# Patient Record
Sex: Male | Born: 1960 | Race: Black or African American | Hispanic: No | Marital: Married | Smoking: Never smoker
Health system: Southern US, Community
[De-identification: ages and names within clinical notes are randomized; demographics above are authoritative.]

## PROBLEM LIST (undated history)

## (undated) DIAGNOSIS — I10 Essential (primary) hypertension: Secondary | ICD-10-CM

## (undated) DIAGNOSIS — E119 Type 2 diabetes mellitus without complications: Secondary | ICD-10-CM

## (undated) HISTORY — PX: WISDOM TOOTH EXTRACTION: SHX21

---

## 2016-05-22 ENCOUNTER — Telehealth: Payer: Self-pay | Admitting: Family Medicine

## 2016-05-22 DIAGNOSIS — Z7689 Persons encountering health services in other specified circumstances: Secondary | ICD-10-CM

## 2016-05-22 DIAGNOSIS — Z125 Encounter for screening for malignant neoplasm of prostate: Secondary | ICD-10-CM

## 2016-05-22 NOTE — Telephone Encounter (Signed)
Labs ordered and patient aware- not on any medication currently

## 2016-05-22 NOTE — Telephone Encounter (Signed)
It would be helpful to know if the patient is on any particular medications if not on any medications I recommend the following CBC, met 7, lipid, liver, PSA if on any medications please let us know

## 2016-05-22 NOTE — Telephone Encounter (Signed)
Please advise which labs are needed

## 2016-05-22 NOTE — Telephone Encounter (Signed)
Pt is needing lab orders sent over for an upcoming wellness visit. Pt is a new pt so there is nothing in the system.

## 2016-05-31 DIAGNOSIS — Z7689 Persons encountering health services in other specified circumstances: Secondary | ICD-10-CM | POA: Diagnosis not present

## 2016-05-31 DIAGNOSIS — Z125 Encounter for screening for malignant neoplasm of prostate: Secondary | ICD-10-CM | POA: Diagnosis not present

## 2016-05-31 DIAGNOSIS — Z0000 Encounter for general adult medical examination without abnormal findings: Secondary | ICD-10-CM | POA: Diagnosis not present

## 2016-06-02 LAB — HEPATIC FUNCTION PANEL
ALT: 17 IU/L (ref 0–44)
AST: 13 IU/L (ref 0–40)
Albumin: 4.8 g/dL (ref 3.5–5.5)
Alkaline Phosphatase: 85 IU/L (ref 39–117)
BILIRUBIN, DIRECT: 0.09 mg/dL (ref 0.00–0.40)
Bilirubin Total: 0.5 mg/dL (ref 0.0–1.2)
Total Protein: 8 g/dL (ref 6.0–8.5)

## 2016-06-02 LAB — LIPID PANEL
CHOLESTEROL TOTAL: 318 mg/dL — AB (ref 100–199)
Chol/HDL Ratio: 7.1 ratio units — ABNORMAL HIGH (ref 0.0–5.0)
HDL: 45 mg/dL (ref >39)
LDL Calculated: 215 mg/dL — ABNORMAL HIGH (ref 0–99)
Triglycerides: 291 mg/dL — ABNORMAL HIGH (ref 0–149)
VLDL CHOLESTEROL CAL: 58 mg/dL — AB (ref 5–40)

## 2016-06-02 LAB — BASIC METABOLIC PANEL
BUN / CREAT RATIO: 16 (ref 9–20)
BUN: 16 mg/dL (ref 6–24)
CHLORIDE: 99 mmol/L (ref 96–106)
CO2: 21 mmol/L (ref 18–29)
Calcium: 9.6 mg/dL (ref 8.7–10.2)
Creatinine, Ser: 0.97 mg/dL (ref 0.76–1.27)
GFR, EST AFRICAN AMERICAN: 102 mL/min/{1.73_m2} (ref >59)
GFR, EST NON AFRICAN AMERICAN: 88 mL/min/{1.73_m2} (ref >59)
Glucose: 187 mg/dL — ABNORMAL HIGH (ref 65–99)
POTASSIUM: 4.8 mmol/L (ref 3.5–5.2)
Sodium: 142 mmol/L (ref 134–144)

## 2016-06-02 LAB — CBC
Hematocrit: 40.3 % (ref 37.5–51.0)
Hemoglobin: 12.7 g/dL — ABNORMAL LOW (ref 13.0–17.7)
MCH: 22 pg — AB (ref 26.6–33.0)
MCHC: 31.5 g/dL (ref 31.5–35.7)
MCV: 70 fL — ABNORMAL LOW (ref 79–97)
PLATELETS: 351 10*3/uL (ref 150–379)
RBC: 5.77 x10E6/uL (ref 4.14–5.80)
RDW: 16.7 % — AB (ref 12.3–15.4)
WBC: 6.5 10*3/uL (ref 3.4–10.8)

## 2016-06-02 LAB — PSA: PROSTATE SPECIFIC AG, SERUM: 0.4 ng/mL (ref 0.0–4.0)

## 2016-06-05 ENCOUNTER — Encounter: Payer: Self-pay | Admitting: Family Medicine

## 2016-06-18 ENCOUNTER — Encounter: Payer: Self-pay | Admitting: Family Medicine

## 2016-07-08 ENCOUNTER — Ambulatory Visit (INDEPENDENT_AMBULATORY_CARE_PROVIDER_SITE_OTHER): Payer: BLUE CROSS/BLUE SHIELD | Admitting: Family Medicine

## 2016-07-08 ENCOUNTER — Encounter: Payer: Self-pay | Admitting: Family Medicine

## 2016-07-08 VITALS — BP 148/90 | Ht 69.0 in | Wt 224.4 lb

## 2016-07-08 DIAGNOSIS — D649 Anemia, unspecified: Secondary | ICD-10-CM | POA: Diagnosis not present

## 2016-07-08 DIAGNOSIS — E1169 Type 2 diabetes mellitus with other specified complication: Secondary | ICD-10-CM | POA: Diagnosis not present

## 2016-07-08 DIAGNOSIS — Z1211 Encounter for screening for malignant neoplasm of colon: Secondary | ICD-10-CM

## 2016-07-08 DIAGNOSIS — Z0000 Encounter for general adult medical examination without abnormal findings: Secondary | ICD-10-CM | POA: Diagnosis not present

## 2016-07-08 DIAGNOSIS — R030 Elevated blood-pressure reading, without diagnosis of hypertension: Secondary | ICD-10-CM

## 2016-07-08 DIAGNOSIS — R739 Hyperglycemia, unspecified: Secondary | ICD-10-CM

## 2016-07-08 DIAGNOSIS — E118 Type 2 diabetes mellitus with unspecified complications: Secondary | ICD-10-CM | POA: Insufficient documentation

## 2016-07-08 DIAGNOSIS — E785 Hyperlipidemia, unspecified: Secondary | ICD-10-CM | POA: Diagnosis not present

## 2016-07-08 LAB — POCT GLYCOSYLATED HEMOGLOBIN (HGB A1C): HEMOGLOBIN A1C: 7.4

## 2016-07-08 MED ORDER — ATORVASTATIN CALCIUM 10 MG PO TABS: 10.0000 mg | ORAL_TABLET | Freq: Every day | ORAL | 4 refills | Status: DC

## 2016-07-08 MED ORDER — METFORMIN HCL 500 MG PO TABS: 500.0000 mg | ORAL_TABLET | Freq: Two times a day (BID) | ORAL | 4 refills | Status: DC

## 2016-07-08 NOTE — Patient Instructions (Signed)
Diabetes Mellitus and Food It is important for you to manage your blood sugar (glucose) level. Your blood glucose level can be greatly affected by what you eat. Eating healthier foods in the appropriate amounts throughout the day at about the same time each day will help you control your blood glucose level. It can also help slow or prevent worsening of your diabetes mellitus. Healthy eating may even help you improve the level of your blood pressure and reach or maintain a healthy weight. General recommendations for healthful eating and cooking habits include:  Eating meals and snacks regularly. Avoid going long periods of time without eating to lose weight.  Eating a diet that consists mainly of plant-based foods, such as fruits, vegetables, nuts, legumes, and whole grains.  Using low-heat cooking methods, such as baking, instead of high-heat cooking methods, such as deep frying.  Work with your dietitian to make sure you understand how to use the Nutrition Facts information on food labels. How can food affect me? Carbohydrates Carbohydrates affect your blood glucose level more than any other type of food. Your dietitian will help you determine how many carbohydrates to eat at each meal and teach you how to count carbohydrates. Counting carbohydrates is important to keep your blood glucose at a healthy level, especially if you are using insulin or taking certain medicines for diabetes mellitus. Alcohol Alcohol can cause sudden decreases in blood glucose (hypoglycemia), especially if you use insulin or take certain medicines for diabetes mellitus. Hypoglycemia can be a life-threatening condition. Symptoms of hypoglycemia (sleepiness, dizziness, and disorientation) are similar to symptoms of having too much alcohol. If your health care provider has given you approval to drink alcohol, do so in moderation and use the following guidelines:  Women should not have more than one drink per day, and men  should not have more than two drinks per day. One drink is equal to: ? 12 oz of beer. ? 5 oz of wine. ? 1 oz of hard liquor.  Do not drink on an empty stomach.  Keep yourself hydrated. Have water, diet soda, or unsweetened iced tea.  Regular soda, juice, and other mixers might contain a lot of carbohydrates and should be counted.  What foods are not recommended? As you make food choices, it is important to remember that all foods are not the same. Some foods have fewer nutrients per serving than other foods, even though they might have the same number of calories or carbohydrates. It is difficult to get your body what it needs when you eat foods with fewer nutrients. Examples of foods that you should avoid that are high in calories and carbohydrates but low in nutrients include:  Trans fats (most processed foods list trans fats on the Nutrition Facts label).  Regular soda.  Juice.  Candy.  Sweets, such as cake, pie, doughnuts, and cookies.  Fried foods.  What foods can I eat? Eat nutrient-rich foods, which will nourish your body and keep you healthy. The food you should eat also will depend on several factors, including:  The calories you need.  The medicines you take.  Your weight.  Your blood glucose level.  Your blood pressure level.  Your cholesterol level.  You should eat a variety of foods, including:  Protein. ? Lean cuts of meat. ? Proteins low in saturated fats, such as fish, egg whites, and beans. Avoid processed meats.  Fruits and vegetables. ? Fruits and vegetables that may help control blood glucose levels, such as apples,   yams.  Dairy products.  Choose fat-free or low-fat dairy products, such as milk, yogurt, and cheese.  Grains, bread, pasta, and rice.  Choose whole grain products, such as multigrain bread, whole oats, and brown rice. These foods may help control blood pressure.  Fats.  Foods containing healthful fats, such as nuts,  avocado, olive oil, canola oil, and fish. Does everyone with diabetes mellitus have the same meal plan? Because every person with diabetes mellitus is different, there is not one meal plan that works for everyone. It is very important that you meet with a dietitian who will help you create a meal plan that is just right for you. This information is not intended to replace advice given to you by your health care provider. Make sure you discuss any questions you have with your health care provider. Document Released: 01/30/2005 Document Revised: 10/11/2015 Document Reviewed: 04/01/2013 Elsevier Interactive Patient Education  2017 [place] DASH stands for "Dietary Approaches to Stop Hypertension." The DASH eating plan is a healthy eating plan that has been shown to reduce high blood pressure (hypertension). Additional health benefits may include reducing the risk of type 2 diabetes mellitus, heart disease, and stroke. The DASH eating plan may also help with weight loss. What do I need to know about the DASH eating plan? For the DASH eating plan, you will follow these general guidelines:  Choose foods with less than 150 milligrams of sodium per serving (as listed on the food label).  Use salt-free seasonings or herbs instead of table salt or sea salt.  Check with your health care provider or pharmacist before using salt substitutes.  Eat lower-sodium products. These are often labeled as "low-sodium" or "no salt added."  Eat fresh foods. Avoid eating a lot of canned foods.  Eat more vegetables, fruits, and low-fat dairy products.  Choose whole grains. Look for the word "whole" as the first word in the ingredient list.  Choose fish and skinless chicken or [place] more often than red meat. Limit fish, poultry, and meat to 6 oz (170 g) each day.  Limit sweets, desserts, sugars, and sugary drinks.  Choose heart-healthy fats.  Eat more home-cooked food and less restaurant,  buffet, and fast food.  Limit fried foods.  Do not fry foods. Cook foods using methods such as baking, boiling, grilling, and broiling instead.  When eating at a restaurant, ask that your food be prepared with less salt, or no salt if possible. What foods can I eat? Seek help from a dietitian for individual calorie needs. Grains  Whole grain or whole wheat bread. Brown rice. Whole grain or whole wheat pasta. Quinoa, bulgur, and whole grain cereals. Low-sodium cereals. Corn or whole wheat flour tortillas. Whole grain cornbread. Whole grain crackers. Low-sodium crackers. Vegetables  Fresh or frozen vegetables (raw, steamed, roasted, or grilled). Low-sodium or reduced-sodium tomato and vegetable juices. Low-sodium or reduced-sodium tomato sauce and paste. Low-sodium or reduced-sodium canned vegetables. Fruits  All fresh, canned (in natural juice), or frozen fruits. Meat and Other Protein Products  Ground beef (85% or leaner), grass-fed beef, or beef trimmed of fat. Skinless chicken or [place]. Ground chicken or [place]. Pork trimmed of fat. All fish and seafood. Eggs. Dried beans, peas, or lentils. Unsalted nuts and seeds. Unsalted canned beans. Dairy  Low-fat dairy products, such as skim or 1% milk, 2% or reduced-fat cheeses, low-fat ricotta or cottage cheese, or plain low-fat yogurt. Low-sodium or reduced-sodium cheeses. Fats and Oils  Tub margarines without  trans fats. Light or reduced-fat mayonnaise and salad dressings (reduced sodium). Avocado. Safflower, olive, or canola oils. Natural peanut or almond butter. Other  Unsalted popcorn and pretzels. The items listed above may not be a complete list of recommended foods or beverages. Contact your dietitian for more options.  What foods are not recommended? Grains  White bread. White pasta. White rice. Refined cornbread. Bagels and croissants. Crackers that contain trans fat. Vegetables  Creamed or fried vegetables. Vegetables in a cheese  sauce. Regular canned vegetables. Regular canned tomato sauce and paste. Regular tomato and vegetable juices. Fruits  Canned fruit in light or heavy syrup. Fruit juice. Meat and Other Protein Products  Fatty cuts of meat. Ribs, chicken wings, bacon, sausage, bologna, salami, chitterlings, fatback, hot dogs, bratwurst, and packaged luncheon meats. Salted nuts and seeds. Canned beans with salt. Dairy  Whole or 2% milk, cream, half-and-half, and cream cheese. Whole-fat or sweetened yogurt. Full-fat cheeses or blue cheese. Nondairy creamers and whipped toppings. Processed cheese, cheese spreads, or cheese curds. Condiments  Onion and garlic salt, seasoned salt, table salt, and sea salt. Canned and packaged gravies. Worcestershire sauce. Tartar sauce. Barbecue sauce. Teriyaki sauce. Soy sauce, including reduced sodium. Steak sauce. Fish sauce. Oyster sauce. Cocktail sauce. Horseradish. Ketchup and mustard. Meat flavorings and tenderizers. Bouillon cubes. Hot sauce. Tabasco sauce. Marinades. Taco seasonings. Relishes. Fats and Oils  Butter, stick margarine, lard, shortening, ghee, and bacon fat. Coconut, palm kernel, or palm oils. Regular salad dressings. Other  Pickles and olives. Salted popcorn and pretzels. The items listed above may not be a complete list of foods and beverages to avoid. Contact your dietitian for more information.  Where can I find more information? National Heart, Lung, and Blood Institute: travelstabloid.com This information is not intended to replace advice given to you by your health care provider. Make sure you discuss any questions you have with your health care provider. Document Released: 04/24/2011 Document Revised: 10/11/2015 Document Reviewed: 03/09/2013 Elsevier Interactive Patient Education  2017 [employer].

## 2016-07-08 NOTE — Progress Notes (Signed)
   Subjective:    Patient ID: Fernando Vance, male    DOB: 12-04-1960, 56 y.o.   MRN: [id number]  HPI The patient comes in today for a wellness visit.    A review of their health history was completed.  A review of medications was also completed.  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   Additional concerns: none Patient does not smoke or drink He tries to watch how he eats He does stay physically active He denies any chest tightness pressure pain shortness breath rectal bleeding or hematuria   Review of Systems  Constitutional: Negative for activity change, appetite change and fever.  HENT: Negative for congestion and rhinorrhea.   Eyes: Negative for discharge.  Respiratory: Negative for cough and wheezing.   Cardiovascular: Negative for chest pain.  Gastrointestinal: Negative for abdominal pain, blood in stool and vomiting.  Genitourinary: Negative for difficulty urinating and frequency.  Musculoskeletal: Negative for neck pain.  Skin: Negative for rash.  Allergic/Immunologic: Negative for environmental allergies and food allergies.  Neurological: Negative for weakness and headaches.  Psychiatric/Behavioral: Negative for agitation.       Objective:   Physical Exam  Constitutional: He appears well-developed and well-nourished.  HENT:  Head: Normocephalic and atraumatic.  Right Ear: External ear normal.  Left Ear: External ear normal.  Nose: Nose normal.  Mouth/Throat: Oropharynx is clear and moist.  Eyes: EOM are normal. Pupils are equal, round, and reactive to light.  Neck: Normal range of motion. Neck supple. No thyromegaly present.  Cardiovascular: Normal rate, regular rhythm and normal heart sounds.   No murmur heard. Pulmonary/Chest: Effort normal and breath  sounds normal. No respiratory distress. He has no wheezes.  Abdominal: Soft. Bowel sounds are normal. He exhibits no distension and no mass. There is no tenderness.  Genitourinary: Prostate normal and penis normal.  Musculoskeletal: Normal range of motion. He exhibits no edema.  Lymphadenopathy:    He has no cervical adenopathy.  Neurological: He is alert. He exhibits normal muscle tone.  Skin: Skin is warm and dry. No erythema.  Psychiatric: He has a normal mood and affect. His behavior is normal. Judgment normal.          Assessment & Plan:  Adult wellness-complete.wellness physical was conducted today. Importance of diet and exercise were discussed in detail. In addition to this a discussion regarding safety was also covered. We also reviewed over immunizations and gave recommendations regarding current immunization needed for age. In addition to this additional areas were also touched on including: Preventative health exams needed: Colonoscopy Is due for colonoscopy  Patient was advised yearly wellness exam  Hemoglobin slightly low. Needs further workup. I recommend the patient be seen by gastroenterology will need colonoscopy. Also CBC, ferritin, TIBC.  Hyperlipidemia LDL excessively elevated risk of heart disease is significant. I recommend statin.  Low-salt diet regular physical activity recommended  Diabetes-A1c 7.4 start metformin 500 mg twice a day watch diet flyer for the diabetes free classes was given to them  Set up for colonoscopy  Follow-up 4 weeks for high blood pressure recheck may need to be on medication low-salt diet regular physical activity

## 2016-07-08 NOTE — Progress Notes (Signed)
Referral ordered in EPIC. 

## 2016-07-09 ENCOUNTER — Encounter: Payer: Self-pay | Admitting: Family Medicine

## 2016-07-11 DIAGNOSIS — D649 Anemia, unspecified: Secondary | ICD-10-CM | POA: Diagnosis not present

## 2016-07-12 LAB — IRON AND TIBC
IRON SATURATION: 17 % (ref 15–55)
IRON: 58 ug/dL (ref 38–169)
Total Iron Binding Capacity: 332 ug/dL (ref 250–450)
UIBC: 274 ug/dL (ref 111–343)

## 2016-07-12 LAB — CBC WITH DIFFERENTIAL/PLATELET
BASOS ABS: 0 10*3/uL (ref 0.0–0.2)
BASOS: 0 %
EOS (ABSOLUTE): 0.1 10*3/uL (ref 0.0–0.4)
Eos: 2 %
HEMOGLOBIN: 12.5 g/dL — AB (ref 13.0–17.7)
Hematocrit: 39.8 % (ref 37.5–51.0)
IMMATURE GRANS (ABS): 0 10*3/uL (ref 0.0–0.1)
Immature Granulocytes: 0 %
LYMPHS: 42 %
Lymphocytes Absolute: 2.3 10*3/uL (ref 0.7–3.1)
MCH: 22 pg — AB (ref 26.6–33.0)
MCHC: 31.4 g/dL — ABNORMAL LOW (ref 31.5–35.7)
MCV: 70 fL — AB (ref 79–97)
MONOCYTES: 9 %
Monocytes Absolute: 0.5 10*3/uL (ref 0.1–0.9)
NEUTROS ABS: 2.6 10*3/uL (ref 1.4–7.0)
Neutrophils: 47 %
Platelets: 358 10*3/uL (ref 150–379)
RBC: 5.68 x10E6/uL (ref 4.14–5.80)
RDW: 16.7 % — ABNORMAL HIGH (ref 12.3–15.4)
WBC: 5.5 10*3/uL (ref 3.4–10.8)

## 2016-07-12 LAB — FERRITIN: FERRITIN: 192 ng/mL (ref 30–400)

## 2016-07-22 ENCOUNTER — Telehealth: Payer: Self-pay

## 2016-07-22 NOTE — Telephone Encounter (Signed)
Gastroenterology Pre-Procedure Review  Request Date: Requesting Physician:   PATIENT REVIEW QUESTIONS: The patient responded to the following health history questions as indicated:    1. Diabetes Melitis: YES 2. Joint replacements in the past 12 months: NO 3. Major health problems in the past 3 months: NO 4. Has an artificial valve or MVP: NO 5. Has a defibrillator: NO 6. Has been advised in past to take antibiotics in advance of a procedure like teeth cleaning: NO 7. Family history of colon cancer: NO 8. Alcohol Use: NO 9. History of sleep apnea: NO  10. History of coronary artery or other vascular stents placed within the last 12 months: NO    MEDICATIONS & ALLERGIES:    Patient reports the following regarding taking any blood thinners:   Plavix? NO Aspirin? NO Coumadin? NO Brilinta? NO Xarelto? NO Eliquis? NO Pradaxa? NO Savaysa? NO Effient? NO  Patient confirms/reports the following medications:  Current Outpatient Prescriptions  Medication Sig Dispense Refill  . atorvastatin (LIPITOR) 10 MG tablet Take 1 tablet (10 mg total) by mouth daily. 30 tablet 4  . metFORMIN (GLUCOPHAGE) 500 MG tablet Take 1 tablet (500 mg total) by mouth 2 (two) times daily with a meal. 60 tablet 4   No current facility-administered medications for this visit.     Patient confirms/reports the following allergies:  No Known Allergies  No orders of the defined types were placed in this encounter.   AUTHORIZATION INFORMATION Primary Insurance: BC[employer]  ID #: [id number],  Group #: [id number] Pre-Cert / Josem Kaufmann required:  Pre-Cert / Auth #:    SCHEDULE INFORMATION: Procedure has been scheduled as follows:  Date: , Time:  Location:   This Gastroenterology Pre-Precedure Review Form is being routed to the following provider(s):

## 2016-07-23 NOTE — Telephone Encounter (Signed)
Ok to schedule.   Day of prep: take 250mg  metformin bid.  AM of procedure: Hold metformin.

## 2016-07-23 NOTE — Telephone Encounter (Signed)
LMOM to call back

## 2016-07-28 NOTE — Telephone Encounter (Signed)
LMOM for him to call me back

## 2016-07-30 ENCOUNTER — Other Ambulatory Visit: Payer: Self-pay

## 2016-07-30 DIAGNOSIS — Z1211 Encounter for screening for malignant neoplasm of colon: Secondary | ICD-10-CM

## 2016-07-30 MED ORDER — PEG 3350-KCL-NA BICARB-NACL 420 G PO SOLR: 4000.0000 mL | ORAL | 0 refills | Status: DC

## 2016-07-31 NOTE — Telephone Encounter (Signed)
Pt came by the office and he is set up for TCS on 08/18/16. He is aware and has instructions

## 2016-08-05 ENCOUNTER — Encounter: Payer: Self-pay | Admitting: Family Medicine

## 2016-08-05 ENCOUNTER — Ambulatory Visit (INDEPENDENT_AMBULATORY_CARE_PROVIDER_SITE_OTHER): Payer: BLUE CROSS/BLUE SHIELD | Admitting: Family Medicine

## 2016-08-05 VITALS — BP 160/110 | Ht 69.0 in | Wt 228.5 lb

## 2016-08-05 DIAGNOSIS — R011 Cardiac murmur, unspecified: Secondary | ICD-10-CM

## 2016-08-05 DIAGNOSIS — E1169 Type 2 diabetes mellitus with other specified complication: Secondary | ICD-10-CM

## 2016-08-05 DIAGNOSIS — E785 Hyperlipidemia, unspecified: Secondary | ICD-10-CM

## 2016-08-05 DIAGNOSIS — I10 Essential (primary) hypertension: Secondary | ICD-10-CM | POA: Diagnosis not present

## 2016-08-05 DIAGNOSIS — E118 Type 2 diabetes mellitus with unspecified complications: Secondary | ICD-10-CM | POA: Diagnosis not present

## 2016-08-05 LAB — POCT GLUCOSE (DEVICE FOR HOME USE): Glucose Fasting, POC: 164 mg/dL — AB (ref 70–99)

## 2016-08-05 MED ORDER — LISINOPRIL 5 MG PO TABS: 5.0000 mg | ORAL_TABLET | Freq: Every day | ORAL | 6 refills | Status: DC

## 2016-08-05 NOTE — Progress Notes (Signed)
   Subjective:    Patient ID: Fernando Vance, male    DOB: 06-30-1960, 56 y.o.   MRN: [id number]  Hypertension  This is a recurrent problem. The current episode started more than 1 month ago. Pertinent negatives include no chest pain.  Exceptionally nice gentleman comes today for a follow-up visit regarding diabetes and regarding his hyperlipidemia as well as diabetes. He is try to watch his diet he is trying to stay active he denies any chest tightness pressure pain shortness breath denies rectal bleeding or hematuria  Patient states no other concerns this visit.   Results for orders placed or performed in visit on 08/05/16  POCT Glucose (Device for Home Use)  Result Value Ref Range   Glucose Fasting, POC 164 (A) 70 - 99 mg/dL   POC Glucose  70 - 99 mg/dl     Review of Systems  Constitutional: Negative for activity change, appetite change and fatigue.  HENT: Negative for congestion.   Respiratory: Negative for cough.   Cardiovascular: Negative for chest pain.  Gastrointestinal: Negative for abdominal pain.  Endocrine: Negative for polydipsia and polyphagia.  Neurological: Negative for weakness.  Psychiatric/Behavioral: Negative for confusion.       Objective:   Physical Exam  Constitutional: He appears well-nourished. No distress.  Cardiovascular: Normal rate and regular rhythm.   Murmur heard. Pulmonary/Chest: Effort normal and breath sounds normal. No respiratory distress.  Musculoskeletal: He exhibits no edema.  Lymphadenopathy:    He has no cervical adenopathy.  Neurological: He is alert.  Psychiatric: His behavior is normal.  Vitals reviewed.         Assessment & Plan:  Hyperglycemia continue the metformin. Will monitor her A1 C's. If A1c is not coming down where we want to we will need to increase the dose  Hyperlipidemia do a follow-up lipid profile if it is not significant improvement we will need to do additional changes to the dosing  Hypertension  start lisinopril 5 mg daily repeat met 7+ also follow-up regarding blood pressure  Patient also has systolic murmur heard mainly on the right sternal border it is a 3 out of 6 strength-patient denies having history of murmur. Patient will need to have echo because of the harshness of this murmur

## 2016-08-18 ENCOUNTER — Encounter (HOSPITAL_COMMUNITY)
Admission: RE | Disposition: A | Discharge status: Home / Self Care | Payer: Self-pay | Source: Ambulatory Visit | Attending: Gastroenterology

## 2016-08-18 ENCOUNTER — Ambulatory Visit: Payer: Self-pay | Admitting: Cardiovascular Disease

## 2016-08-18 ENCOUNTER — Encounter (HOSPITAL_COMMUNITY): Payer: Self-pay

## 2016-08-18 ENCOUNTER — Ambulatory Visit (HOSPITAL_COMMUNITY)
Admission: RE | Admit: 2016-08-18 | Discharge: 2016-08-18 | Disposition: A | Discharge status: Home / Self Care | Payer: BLUE CROSS/BLUE SHIELD | Source: Ambulatory Visit | Attending: Gastroenterology | Admitting: Gastroenterology

## 2016-08-18 DIAGNOSIS — K648 Other hemorrhoids: Secondary | ICD-10-CM | POA: Diagnosis not present

## 2016-08-18 DIAGNOSIS — Z1211 Encounter for screening for malignant neoplasm of colon: Secondary | ICD-10-CM

## 2016-08-18 DIAGNOSIS — I10 Essential (primary) hypertension: Secondary | ICD-10-CM | POA: Insufficient documentation

## 2016-08-18 DIAGNOSIS — Q438 Other specified congenital malformations of intestine: Secondary | ICD-10-CM | POA: Diagnosis not present

## 2016-08-18 DIAGNOSIS — D125 Benign neoplasm of sigmoid colon: Secondary | ICD-10-CM | POA: Insufficient documentation

## 2016-08-18 DIAGNOSIS — K5730 Diverticulosis of large intestine without perforation or abscess without bleeding: Secondary | ICD-10-CM | POA: Insufficient documentation

## 2016-08-18 DIAGNOSIS — Z7984 Long term (current) use of oral hypoglycemic drugs: Secondary | ICD-10-CM | POA: Diagnosis not present

## 2016-08-18 DIAGNOSIS — Z79899 Other long term (current) drug therapy: Secondary | ICD-10-CM | POA: Insufficient documentation

## 2016-08-18 DIAGNOSIS — E119 Type 2 diabetes mellitus without complications: Secondary | ICD-10-CM | POA: Diagnosis not present

## 2016-08-18 DIAGNOSIS — Z1212 Encounter for screening for malignant neoplasm of rectum: Secondary | ICD-10-CM | POA: Diagnosis not present

## 2016-08-18 DIAGNOSIS — K644 Residual hemorrhoidal skin tags: Secondary | ICD-10-CM | POA: Insufficient documentation

## 2016-08-18 HISTORY — DX: Essential (primary) hypertension: I10

## 2016-08-18 HISTORY — DX: Type 2 diabetes mellitus without complications: E11.9

## 2016-08-18 HISTORY — PX: COLONOSCOPY: SHX5424

## 2016-08-18 HISTORY — PX: POLYPECTOMY: SHX5525

## 2016-08-18 LAB — GLUCOSE, CAPILLARY: Glucose-Capillary: 159 mg/dL — ABNORMAL HIGH (ref 65–99)

## 2016-08-18 SURGERY — COLONOSCOPY
Anesthesia: Moderate Sedation

## 2016-08-18 MED ORDER — MIDAZOLAM HCL 5 MG/5ML IJ SOLN
INTRAMUSCULAR | Status: AC
  Filled 2016-08-18: qty 10

## 2016-08-18 MED ORDER — MEPERIDINE HCL 100 MG/ML IJ SOLN
INTRAMUSCULAR | Status: DC | PRN
Start: 2016-08-18 — End: 2016-08-18
  Administered 2016-08-18 (×2): 50 mg via INTRAVENOUS

## 2016-08-18 MED ORDER — SODIUM CHLORIDE 0.9 % IV SOLN
INTRAVENOUS | Status: DC
  Administered 2016-08-18: 10:00:00 via INTRAVENOUS

## 2016-08-18 MED ORDER — MEPERIDINE HCL 100 MG/ML IJ SOLN
INTRAMUSCULAR | Status: AC
  Filled 2016-08-18: qty 2

## 2016-08-18 MED ORDER — MIDAZOLAM HCL 5 MG/5ML IJ SOLN
INTRAMUSCULAR | Status: DC | PRN
  Administered 2016-08-18 (×2): 2 mg via INTRAVENOUS
  Administered 2016-08-18: 1 mg via INTRAVENOUS

## 2016-08-18 NOTE — Discharge Instructions (Signed)
You have moderate size lnternal and large external hemorrhoids and diverticulosis IN YOUR LEFT COLON. YOU HAD ONE SMALL POLYP REMOVED.   CONTINUE YOUR WEIGHT LOSS EFFORTS.  WHILE I DO NOT WANT TO ALARM YOU, YOUR BODY MASS INDEX IS OVER 30 WHICH MEANS YOU ARE OBESE. OBESITY IS ASSOCIATED WITH AN INCREASE RISK FOR ALL CANCERS, INCLUDING ESOPHAGEAL AND COLON CANCER.   DRINK WATER TO KEEP YOUR URINE LIGHT YELLOW.  FOLLOW A HIGH FIBER DIET. AVOID ITEMS THAT CAUSE BLOATING. See info below.   YOUR BIOPSY RESULTS WILL BE AVAILABLE IN MY CHART AFTER APR 5 AND MY OFFICE WILL CONTACT YOU IN 10-14 DAYS WITH YOUR RESULTS.   Next colonoscopy in 5-10 years.  Colonoscopy Care After Read the instructions outlined below and refer to this sheet in the next week. These discharge instructions provide you with general information on caring for yourself after you leave the hospital. While your treatment has been planned according to the most current medical practices available, unavoidable complications occasionally occur. If you have any problems or questions after discharge, call DR. Lilie Vezina, [phone].  ACTIVITY  You may resume your regular activity, but move at a slower pace for the next 24 hours.   Take frequent rest periods for the next 24 hours.   Walking will help get rid of the air and reduce the bloated feeling in your belly (abdomen).   No driving for 24 hours (because of the medicine (anesthesia) used during the test).   You may shower.   Do not sign any important legal documents or operate any machinery for 24 hours (because of the anesthesia used during the test).    NUTRITION  Drink plenty of fluids.   You may resume your normal diet as instructed by your doctor.   Begin with a light meal and progress to your normal diet. Heavy or fried foods are harder to digest and may make you feel sick to your stomach (nauseated).   Avoid alcoholic beverages for 24 hours or as instructed.     MEDICATIONS  You may resume your normal medications.   WHAT YOU CAN EXPECT TODAY  Some feelings of bloating in the abdomen.   Passage of more gas than usual.   Spotting of blood in your stool or on the toilet paper  .  IF YOU HAD POLYPS REMOVED DURING THE COLONOSCOPY:  Eat a soft diet IF YOU HAVE NAUSEA, BLOATING, ABDOMINAL PAIN, OR VOMITING.    FINDING OUT THE RESULTS OF YOUR TEST Not all test results are available during your visit. DR. Oneida Alar WILL CALL YOU WITHIN 14 DAYS OF YOUR PROCEDUE WITH YOUR RESULTS. Do not assume everything is normal if you have not heard from DR. Dyonna Jaspers, CALL HER OFFICE AT [phone].  SEEK IMMEDIATE MEDICAL ATTENTION AND CALL THE OFFICE: [phone] IF:  You have more than a spotting of blood in your stool.   Your belly is swollen (abdominal distention).   You are nauseated or vomiting.   You have a temperature over 101F.   You have abdominal pain or discomfort that is severe or gets worse throughout the day.  High-Fiber Diet A high-fiber diet changes your normal diet to include more whole grains, legumes, fruits, and vegetables. Changes in the diet involve replacing refined carbohydrates with unrefined foods. The calorie level of the diet is essentially unchanged. The Dietary Reference Intake (recommended amount) for adult males is 38 grams per day. For adult females, it is 25 grams per day. Pregnant and lactating women  should consume 28 grams of fiber per day. Fiber is the intact part of a plant that is not broken down during digestion. Functional fiber is fiber that has been isolated from the plant to provide a beneficial effect in the body. PURPOSE  Increase stool bulk.   Ease and regulate bowel movements.   Lower cholesterol.   REDUCE RISK OF COLON CANCER  INDICATIONS THAT YOU NEED MORE FIBER  Constipation and hemorrhoids.   Uncomplicated diverticulosis (intestine condition) and irritable bowel syndrome.   Weight  management.   As a protective measure against hardening of the arteries (atherosclerosis), diabetes, and cancer.   GUIDELINES FOR INCREASING FIBER IN THE DIET  Start adding fiber to the diet slowly. A gradual increase of about 5 more grams (2 slices of whole-wheat bread, 2 servings of most fruits or vegetables, or 1 bowl of high-fiber cereal) per day is best. Too rapid an increase in fiber may result in constipation, flatulence, and bloating.   Drink enough water and fluids to keep your urine clear or pale yellow. Water, juice, or caffeine-free drinks are recommended. Not drinking enough fluid may cause constipation.   Eat a variety of high-fiber foods rather than one type of fiber.   Try to increase your intake of fiber through using high-fiber foods rather than fiber pills or supplements that contain small amounts of fiber.   The goal is to change the types of food eaten. Do not supplement your present diet with high-fiber foods, but replace foods in your present diet.   INCLUDE A VARIETY OF FIBER SOURCES  Replace refined and processed grains with whole grains, canned fruits with fresh fruits, and incorporate other fiber sources. White rice, white breads, and most bakery goods contain little or no fiber.   Brown whole-grain rice, buckwheat oats, and many fruits and vegetables are all good sources of fiber. These include: broccoli, Brussels sprouts, cabbage, cauliflower, beets, sweet potatoes, white potatoes (skin on), carrots, tomatoes, eggplant, squash, berries, fresh fruits, and dried fruits.   Cereals appear to be the richest source of fiber. Cereal fiber is found in whole grains and bran. Bran is the fiber-rich outer coat of cereal grain, which is largely removed in refining. In whole-grain cereals, the bran remains. In breakfast cereals, the largest amount of fiber is found in those with "bran" in their names. The fiber content is sometimes indicated on the label.   You may need to  include additional fruits and vegetables each day.   In baking, for 1 cup white flour, you may use the following substitutions:   1 cup whole-wheat flour minus 2 tablespoons.   1/2 cup white flour plus 1/2 cup whole-wheat flour.   Polyps, Colon  A polyp is extra tissue that grows inside your body. Colon polyps grow in the large intestine. The large intestine, also called the colon, is part of your digestive system. It is a long, hollow tube at the end of your digestive tract where your body makes and stores stool. Most polyps are not dangerous. They are benign. This means they are not cancerous. But over time, some types of polyps can turn into cancer. Polyps that are smaller than a pea are usually not harmful. But larger polyps could someday become or may already be cancerous. To be safe, doctors remove all polyps and test them.    PREVENTION There is not one sure way to prevent polyps. You might be able to lower your risk of getting them if you:  Eat  more fruits and vegetables and less fatty food.   Do not smoke.   Avoid alcohol.   Exercise every day.   Lose weight if you are overweight.   Eating more calcium and folate can also lower your risk of getting polyps. Some foods that are rich in calcium are milk, cheese, and broccoli. Some foods that are rich in folate are chickpeas, kidney beans, and spinach.    Diverticulosis Diverticulosis is a common condition that develops when small pouches (diverticula) form in the wall of the colon. The risk of diverticulosis increases with age. It happens more often in people who eat a low-fiber diet. Most individuals with diverticulosis have no symptoms. Those individuals with symptoms usually experience belly (abdominal) pain, constipation, or loose stools (diarrhea).  HOME CARE INSTRUCTIONS  Increase the amount of fiber in your diet as directed by your caregiver or dietician. This may reduce symptoms of diverticulosis.   Drink at least 6  to 8 glasses of water each day to prevent constipation.   Try not to strain when you have a bowel movement.   Avoiding nuts and seeds to prevent complications is NOT NECESSARY.   FOODS HAVING HIGH FIBER CONTENT INCLUDE:  Fruits. Apple, peach, pear, tangerine, raisins, prunes.   Vegetables. Brussels sprouts, asparagus, broccoli, cabbage, carrot, cauliflower, romaine lettuce, spinach, summer squash, tomato, winter squash, zucchini.   Starchy Vegetables. Baked beans, kidney beans, lima beans, split peas, lentils, potatoes (with skin).   Grains. Whole wheat bread, brown rice, bran flake cereal, plain oatmeal, white rice, shredded wheat, bran muffins.    SEEK IMMEDIATE MEDICAL CARE IF:  You develop increasing pain or severe bloating.   You have an oral temperature above 101F.   You develop vomiting or bowel movements that are bloody or black.   Hemorrhoids Hemorrhoids are dilated (enlarged) veins around the rectum. Sometimes clots will form in the veins. This makes them swollen and painful. These are called thrombosed hemorrhoids. Causes of hemorrhoids include:  Constipation.   Straining to have a bowel movement.   HEAVY LIFTING  HOME CARE INSTRUCTIONS  Eat a well balanced diet and drink 6 to 8 glasses of water every day to avoid constipation. You may also use a bulk laxative.   Avoid straining to have bowel movements.   Keep anal area dry and clean.   Do not use a donut shaped pillow or sit on the toilet for long periods. This increases blood pooling and pain.   Move your bowels when your body has the urge; this will require less straining and will decrease pain and pressure.

## 2016-08-18 NOTE — Op Note (Signed)
[facility] Patient Name: Fernando Vance Procedure Date: 08/18/2016 10:23 AM MRN: [id number] Date of Birth: 19-Nov-1960 Attending MD: Barney Drain , MD CSN: [id number] Age: 56 Admit Type: Outpatient Procedure:                Colonoscopy WITH SNARE POLYPECTOMY Indications:              Screening for colorectal malignant neoplasm Providers:                Barney Drain, MD, Lurline Del, RN, Aram Candela Referring MD:             Elayne Snare. Luking Medicines:                Meperidine 100 mg IV, Midazolam 5 mg IV Complications:            No immediate complications. Estimated Blood Loss:     Estimated blood loss: none. Procedure:                Pre-Anesthesia Assessment:                           - Prior to the procedure, a History and Physical                            was performed, and patient medications and                            allergies were reviewed. The patient's tolerance of                            previous anesthesia was also reviewed. The risks                            and benefits of the procedure and the sedation                            options and risks were discussed with the patient.                            All questions were answered, and informed consent                            was obtained. Prior Anticoagulants: The patient has                            taken no previous anticoagulant or antiplatelet                            agents. ASA Grade Assessment: II - A patient with                            mild systemic disease. After reviewing the risks                            and benefits, the patient was deemed in  satisfactory condition to undergo the procedure.                            After obtaining informed consent, the colonoscope                            was passed under direct vision. Throughout the                            procedure, the patient's blood pressure, pulse, and                             oxygen saturations were monitored continuously. The                            [id number]) scope was introduced through                            the anus and advanced to the the cecum, identified                            by appendiceal orifice and ileocecal valve. The                            ileocecal valve, appendiceal orifice, and rectum                            were photographed. The colonoscopy was somewhat                            difficult due to significant looping. Successful                            completion of the procedure was aided by                            straightening and shortening the scope to obtain                            bowel loop reduction and COLOWRAP. The patient                            tolerated the procedure fairly well. The quality of                            the bowel preparation was excellent. Scope In: 10:45:08 AM Scope Out: 11:03:23 AM Scope Withdrawal Time: 0 hours 15 minutes 6 seconds  Total Procedure Duration: 0 hours 18 minutes 15 seconds  Findings:      The recto-sigmoid colon and sigmoid colon were moderately redundant.      A 6 mm polyp was found in the mid sigmoid colon. The polyp was flat. The       polyp was removed with a hot snare. Resection and retrieval were       complete.  A few small and large-mouthed diverticula were found in the sigmoid       colon.      External hemorrhoids were found during retroflexion. The hemorrhoids       were large.      Internal hemorrhoids were found during retroflexion. The hemorrhoids       were medium-sized. Impression:               - Redundant LEFT colon.                           - One 6 mm polyp in the mid sigmoid colon, removed                            with a hot snare. Resected and retrieved.                           - Diverticulosis in the sigmoid colon.                           - External hemorrhoids.                           - Internal  hemorrhoids. Moderate Sedation:      Moderate (conscious) sedation was administered by the endoscopy nurse       and supervised by the endoscopist. The following parameters were       monitored: oxygen saturation, heart rate, blood pressure, and response       to care. Total physician intraservice time was 29 minutes. Recommendation:           - Repeat colonoscopy in 5-10 years for surveillance.                           - High fiber diet.                           - Continue present medications.                           - Await pathology results.                           - Patient has a contact number available for                            emergencies. The signs and symptoms of potential                            delayed complications were discussed with the                            patient. Return to normal activities tomorrow.                            Written discharge instructions were provided to the  patient. Procedure Code(s):        --- Professional ---                           [phone], Colonoscopy, flexible; with removal of                            tumor(s), polyp(s), or other lesion(s) by snare                            technique                           99152, Moderate sedation services provided by the                            same physician or other qualified health care                            professional performing the diagnostic or                            therapeutic service that the sedation supports,                            requiring the presence of an independent trained                            observer to assist in the monitoring of the                            patient's level of consciousness and physiological                            status; initial 15 minutes of intraservice time,                            patient age 76 years or older                           [id number], Moderate sedation services; each  additional                            15 minutes intraservice time Diagnosis Code(s):        --- Professional ---                           Z12.11, Encounter for screening for malignant                            neoplasm of colon                           D12.5, Benign neoplasm of sigmoid colon  K64.4, Residual hemorrhoidal skin tags                           K64.8, Other hemorrhoids                           K57.30, Diverticulosis of large intestine without                            perforation or abscess without bleeding                           Q43.8, Other specified congenital malformations of                            intestine CPT copyright 2016 [employer]. All rights reserved. The codes documented in this report are preliminary and upon coder review may  be revised to meet current compliance requirements. Barney Drain, MD Barney Drain, MD 08/18/2016 11:11:18 AM This report has been signed electronically. Number of Addenda: 0

## 2016-08-18 NOTE — H&P (Signed)
Primary Care Physician:  Sallee Lange, MD Primary Gastroenterologist:  Dr. Oneida Alar  Pre-Procedure History & Physical: HPI:  Fernando Vance is a 56 y.o. male here for [place].  Past Medical History:  Diagnosis Date  . Diabetes mellitus without complication ([place])   . Hypertension     Past Surgical History:  Procedure Laterality Date  . WISDOM TOOTH EXTRACTION     20 years ago    Prior to Admission medications   Medication Sig Start Date End Date Taking? Authorizing Provider  atorvastatin (LIPITOR) 10 MG tablet Take 1 tablet (10 mg total) by mouth daily. 07/08/16  Yes Kathyrn Drown, MD  lisinopril (PRINIVIL,ZESTRIL) 5 MG tablet Take 1 tablet (5 mg total) by mouth daily. 08/05/16  Yes Kathyrn Drown, MD  metFORMIN (GLUCOPHAGE) 500 MG tablet Take 1 tablet (500 mg total) by mouth 2 (two) times daily with a meal. 07/08/16  Yes Kathyrn Drown, MD    Allergies as of 07/30/2016  . (No Known Allergies)    History reviewed. No pertinent family history.  Social History   Social History  . Marital status: Married    Spouse name: N/A  . Number of children: N/A  . Years of education: N/A   Occupational History  . Not on file.   Social History Main Topics  . Smoking status: Never Smoker  . Smokeless tobacco: Never Used  . Alcohol use No  . Drug use: No  . Sexual activity: Not on file   Other Topics Concern  . Not on file   Social History Narrative  . No narrative on file    Review of Systems: See HPI, otherwise negative ROS   Physical Exam: BP (!) 202/94   Pulse (!) 110   Temp 98.8 F (37.1 C) (Oral)   Resp 19   SpO2 99%  General:   Alert,  pleasant and cooperative in NAD Head:  Normocephalic and atraumatic. Neck:  Supple; Lungs:  Clear throughout to auscultation.    Heart:  Regular rate and rhythm. Abdomen:  Soft, nontender and nondistended. Normal bowel sounds, without guarding, and without rebound.   Neurologic:  Alert and  oriented x4;   grossly normal neurologically.  Impression/Plan:     SCREENING  Plan:  1. TCS TODAY. DISCUSSED PROCEDURE, BENEFITS, & RISKS: < 1% chance of medication reaction, bleeding, perforation, or rupture of spleen/liver.

## 2016-08-25 ENCOUNTER — Encounter (HOSPITAL_COMMUNITY): Payer: Self-pay | Admitting: Gastroenterology

## 2016-08-27 ENCOUNTER — Encounter: Payer: Self-pay | Admitting: Cardiovascular Disease

## 2016-08-27 ENCOUNTER — Ambulatory Visit (INDEPENDENT_AMBULATORY_CARE_PROVIDER_SITE_OTHER): Payer: BLUE CROSS/BLUE SHIELD | Admitting: Cardiovascular Disease

## 2016-08-27 ENCOUNTER — Telehealth: Payer: Self-pay | Admitting: Gastroenterology

## 2016-08-27 VITALS — BP 190/110 | HR 113 | Ht 68.0 in | Wt 227.0 lb

## 2016-08-27 DIAGNOSIS — R011 Cardiac murmur, unspecified: Secondary | ICD-10-CM | POA: Diagnosis not present

## 2016-08-27 DIAGNOSIS — I10 Essential (primary) hypertension: Secondary | ICD-10-CM

## 2016-08-27 DIAGNOSIS — R9431 Abnormal electrocardiogram [ECG] [EKG]: Secondary | ICD-10-CM

## 2016-08-27 DIAGNOSIS — I517 Cardiomegaly: Secondary | ICD-10-CM | POA: Diagnosis not present

## 2016-08-27 MED ORDER — LISINOPRIL 10 MG PO TABS: 10.0000 mg | ORAL_TABLET | Freq: Every day | ORAL | 3 refills | Status: DC

## 2016-08-27 NOTE — Progress Notes (Signed)
CARDIOLOGY CONSULT NOTE  Patient ID: BRADIN MCADORY MRN: [id number] DOB/AGE: 55/25/1962 55 y.o.  Admit date: (Not on file) Primary Physician: Sallee Lange, MD Referring Physician: Wolfgang Phoenix  Reason for Consultation: murmur  HPI: Fernando Vance is a 56 y.o. male who is being seen today for the evaluation of a murmur at the request of Luking, Elayne Snare, MD. Past medical history includes hypertension, diabetes, and hyperlipidemia.  ECG performed in the office today which I ordered and personally interpreted demonstrates normal sinus tachycardia, 108 bpm, with LVH and diffuse, significant repolarization abnormalities.  The patient denies any symptoms of chest pain, palpitations, shortness of breath, lightheadedness, dizziness, leg swelling, orthopnea, PND, and syncope.     No Known Allergies  Current Outpatient Prescriptions  Medication Sig Dispense Refill  . atorvastatin (LIPITOR) 10 MG tablet Take 1 tablet (10 mg total) by mouth daily. 30 tablet 4  . lisinopril (PRINIVIL,ZESTRIL) 5 MG tablet Take 1 tablet (5 mg total) by mouth daily. 30 tablet 6  . metFORMIN (GLUCOPHAGE) 500 MG tablet Take 1 tablet (500 mg total) by mouth 2 (two) times daily with a meal. 60 tablet 4   No current facility-administered medications for this visit.     Past Medical History:  Diagnosis Date  . Diabetes mellitus without complication ([place])   . Hypertension     Past Surgical History:  Procedure Laterality Date  . COLONOSCOPY N/A 08/18/2016   Procedure: COLONOSCOPY;  Surgeon: Danie Binder, MD;  Location: [facility];  Service: Endoscopy;  Laterality: N/A;  1030   . POLYPECTOMY  08/18/2016   Procedure: POLYPECTOMY;  Surgeon: Danie Binder, MD;  Location: [facility];  Service: Endoscopy;;  sigmoid  . WISDOM TOOTH EXTRACTION     20 years ago    Social History   Social History  . Marital status: Married    Spouse name: N/A  . Number of children: N/A  . Years of education: N/A     Occupational History  . Not on file.   Social History Main Topics  . Smoking status: Never Smoker  . Smokeless tobacco: Never Used  . Alcohol use No  . Drug use: No  . Sexual activity: Not on file   Other Topics Concern  . Not on file   Social History Narrative  . No narrative on file     No family history of premature CAD in 1st degree relatives.  Current Meds  Medication Sig  . atorvastatin (LIPITOR) 10 MG tablet Take 1 tablet (10 mg total) by mouth daily.  Marland Kitchen lisinopril (PRINIVIL,ZESTRIL) 5 MG tablet Take 1 tablet (5 mg total) by mouth daily.  . metFORMIN (GLUCOPHAGE) 500 MG tablet Take 1 tablet (500 mg total) by mouth 2 (two) times daily with a meal.      Review of systems complete and found to be negative unless listed above in HPI    Physical exam Blood pressure (!) 180/102, pulse (!) 113, height 5\' 8"  (1.727 m), weight 227 lb (103 kg), SpO2 98 %. General: NAD Neck: No JVD, no thyromegaly or thyroid nodule.  Lungs: Clear to auscultation bilaterally with normal respiratory effort. CV: Nondisplaced PMI. Regular rate and rhythm, normal S1/S2, no G9/Q1, 3/6 systolic murmur heard throughout precordium.  No peripheral edema.  No carotid bruit.    Abdomen: Soft, nontender, no distention.  Skin: Intact without lesions or rashes.  Neurologic: Alert and oriented x 3.  Psych: Normal affect. Extremities: No clubbing  or cyanosis.  HEENT: Normal.   ECG: Most recent ECG reviewed.   Labs: Lab Results  Component Value Date/Time   K 4.8 05/31/2016 11:00 AM   BUN 16 05/31/2016 11:00 AM   CREATININE 0.97 05/31/2016 11:00 AM   ALT 17 05/31/2016 11:00 AM     Lipids: Lab Results  Component Value Date/Time   LDLCALC 215 (H) 05/31/2016 11:00 AM   CHOL 318 (H) 05/31/2016 11:00 AM   TRIG 291 (H) 05/31/2016 11:00 AM   HDL 45 05/31/2016 11:00 AM        ASSESSMENT AND PLAN:  1. Murmur: The patient has significant LVH by ECG criteria.This may be an outflow tract murmur  due to this. I will order a 2-D echocardiogram with Doppler to evaluate cardiac structure, function, and regional wall motion.  2. Hypertension: Markedly elevated. I will increase lisinopril to 10 mg daily.  3. Hyperlipidemia: Continue Lipitor.  Disposition: Follow up in 6 weeks.  Signed: Kate Sable, M.D., F.A.C.C.  08/27/2016, 1:22 PM

## 2016-08-27 NOTE — Patient Instructions (Addendum)
Your physician recommends that you schedule a follow-up appointment in: 6 weeks     INCREASE Lisinopril to 10 mg daily     Your physician has requested that you have an echocardiogram. Echocardiography is a painless test that uses sound waves to create images of your heart. It provides your doctor with information about the size and shape of your heart and how well your heart's chambers and valves are working. This procedure takes approximately one hour. There are no restrictions for this procedure.      Thank you for choosing [facility] !

## 2016-08-27 NOTE — Telephone Encounter (Signed)
Please call pt. He had A simple adenoma removed from hIS colon.    CONTINUE YOUR WEIGHT LOSS EFFORTS.    DRINK WATER TO KEEP YOUR URINE LIGHT YELLOW.  FOLLOW A HIGH FIBER DIET. AVOID ITEMS THAT CAUSE BLOATING.   Next colonoscopy in 5-10 years.

## 2016-08-28 ENCOUNTER — Ambulatory Visit (INDEPENDENT_AMBULATORY_CARE_PROVIDER_SITE_OTHER): Payer: BLUE CROSS/BLUE SHIELD | Admitting: Family Medicine

## 2016-08-28 ENCOUNTER — Encounter: Payer: Self-pay | Admitting: Family Medicine

## 2016-08-28 VITALS — BP 148/92 | Ht 69.0 in | Wt 227.4 lb

## 2016-08-28 DIAGNOSIS — I10 Essential (primary) hypertension: Secondary | ICD-10-CM

## 2016-08-28 NOTE — Progress Notes (Signed)
   Subjective:    Patient ID: Fernando Vance, male    DOB: [birth year], 56 y.o.   MRN: [id number]  Hypertension  This is a chronic problem. The current episode started more than 1 month ago. Pertinent negatives include no chest pain, headaches or shortness of breath.   Patient saw cardiology they're doing an echo the increase his blood pressure medicine to 10 mg he did not take it today he is watching his.  Patient states no other concerns this visit.  Review of Systems  Constitutional: Negative for activity change, fatigue and fever.  Respiratory: Negative for cough and shortness of breath.   Cardiovascular: Negative for chest pain and leg swelling.  Neurological: Negative for headaches.       Objective:   Physical Exam  Constitutional: He appears well-nourished. No distress.  Cardiovascular: Normal rate, regular rhythm and normal heart sounds.   No murmur heard. Pulmonary/Chest: Effort normal and breath sounds normal. No respiratory distress.  Musculoskeletal: He exhibits no edema.  Lymphadenopathy:    He has no cervical adenopathy.  Neurological: He is alert.  Psychiatric: His behavior is normal.  Vitals reviewed.         Assessment & Plan:  HTN subpar control will follow-up for a blood pressure recheck within the next few weeks follow-up office visit several months continue the increased dose of blood pressure medicine may need additional medication

## 2016-08-28 NOTE — Patient Instructions (Signed)
DASH Eating Plan DASH stands for "Dietary Approaches to Stop Hypertension." The DASH eating plan is a healthy eating plan that has been shown to reduce high blood pressure (hypertension). It may also reduce your risk for type 2 diabetes, heart disease, and stroke. The DASH eating plan may also help with weight loss. What are tips for following this plan? General guidelines  Avoid eating more than 2,300 mg (milligrams) of salt (sodium) a day. If you have hypertension, you may need to reduce your sodium intake to 1,500 mg a day.  Limit alcohol intake to no more than 1 drink a day for nonpregnant women and 2 drinks a day for men. One drink equals 12 oz of beer, 5 oz of wine, or 1 oz of hard liquor.  Work with your health care provider to maintain a healthy body weight or to lose weight. Ask what an ideal weight is for you.  Get at least 30 minutes of exercise that causes your heart to beat faster (aerobic exercise) most days of the week. Activities may include walking, swimming, or biking.  Work with your health care provider or diet and nutrition specialist (dietitian) to adjust your eating plan to your individual calorie needs. Reading food labels  Check food labels for the amount of sodium per serving. Choose foods with less than 5 percent of the Daily Value of sodium. Generally, foods with less than 300 mg of sodium per serving fit into this eating plan.  To find whole grains, look for the word "whole" as the first word in the ingredient list. Shopping  Buy products labeled as "low-sodium" or "no salt added."  Buy fresh foods. Avoid canned foods and premade or frozen meals. Cooking  Avoid adding salt when cooking. Use salt-free seasonings or herbs instead of table salt or sea salt. Check with your health care provider or pharmacist before using salt substitutes.  Do not fry foods. Cook foods using healthy methods such as baking, boiling, grilling, and broiling instead.  Cook with  heart-healthy oils, such as olive, canola, soybean, or sunflower oil. Meal planning   Eat a balanced diet that includes: ? 5 or more servings of fruits and vegetables each day. At each meal, try to fill half of your plate with fruits and vegetables. ? Up to 6-8 servings of whole grains each day. ? Less than 6 oz of lean meat, poultry, or fish each day. A 3-oz serving of meat is about the same size as a deck of cards. One egg equals 1 oz. ? 2 servings of low-fat dairy each day. ? A serving of nuts, seeds, or beans 5 times each week. ? Heart-healthy fats. Healthy fats called Omega-3 fatty acids are found in foods such as flaxseeds and coldwater fish, like sardines, salmon, and mackerel.  Limit how much you eat of the following: ? Canned or prepackaged foods. ? Food that is high in trans fat, such as fried foods. ? Food that is high in saturated fat, such as fatty meat. ? Sweets, desserts, sugary drinks, and other foods with added sugar. ? Full-fat dairy products.  Do not salt foods before eating.  Try to eat at least 2 vegetarian meals each week.  Eat more home-cooked food and less restaurant, buffet, and fast food.  When eating at a restaurant, ask that your food be prepared with less salt or no salt, if possible. What foods are recommended? The items listed may not be a complete list. Talk with your dietitian about what   dietary choices are best for you. Grains Whole-grain or whole-wheat bread. Whole-grain or whole-wheat pasta. Brown rice. Oatmeal. Quinoa. Bulgur. Whole-grain and low-sodium cereals. Pita bread. Low-fat, low-sodium crackers. Whole-wheat flour tortillas. Vegetables Fresh or frozen vegetables (raw, steamed, roasted, or grilled). Low-sodium or reduced-sodium tomato and vegetable juice. Low-sodium or reduced-sodium tomato sauce and tomato paste. Low-sodium or reduced-sodium canned vegetables. Fruits All fresh, dried, or frozen fruit. Canned fruit in natural juice (without  added sugar). Meat and other protein foods Skinless chicken or turkey. Ground chicken or turkey. Pork with fat trimmed off. Fish and seafood. Egg whites. Dried beans, peas, or lentils. Unsalted nuts, nut butters, and seeds. Unsalted canned beans. Lean cuts of beef with fat trimmed off. Low-sodium, lean deli meat. Dairy Low-fat (1%) or fat-free (skim) milk. Fat-free, low-fat, or reduced-fat cheeses. Nonfat, low-sodium ricotta or cottage cheese. Low-fat or nonfat yogurt. Low-fat, low-sodium cheese. Fats and oils Soft margarine without trans fats. Vegetable oil. Low-fat, reduced-fat, or light mayonnaise and salad dressings (reduced-sodium). Canola, safflower, olive, soybean, and sunflower oils. Avocado. Seasoning and other foods Herbs. Spices. Seasoning mixes without salt. Unsalted popcorn and pretzels. Fat-free sweets. What foods are not recommended? The items listed may not be a complete list. Talk with your dietitian about what dietary choices are best for you. Grains Baked goods made with fat, such as croissants, muffins, or some breads. Dry pasta or rice meal packs. Vegetables Creamed or fried vegetables. Vegetables in a cheese sauce. Regular canned vegetables (not low-sodium or reduced-sodium). Regular canned tomato sauce and paste (not low-sodium or reduced-sodium). Regular tomato and vegetable juice (not low-sodium or reduced-sodium). Pickles. Olives. Fruits Canned fruit in a light or heavy syrup. Fried fruit. Fruit in cream or butter sauce. Meat and other protein foods Fatty cuts of meat. Ribs. Fried meat. Bacon. Sausage. Bologna and other processed lunch meats. Salami. Fatback. Hotdogs. Bratwurst. Salted nuts and seeds. Canned beans with added salt. Canned or smoked fish. Whole eggs or egg yolks. Chicken or turkey with skin. Dairy Whole or 2% milk, cream, and half-and-half. Whole or full-fat cream cheese. Whole-fat or sweetened yogurt. Full-fat cheese. Nondairy creamers. Whipped toppings.  Processed cheese and cheese spreads. Fats and oils Butter. Stick margarine. Lard. Shortening. Ghee. Bacon fat. Tropical oils, such as coconut, palm kernel, or palm oil. Seasoning and other foods Salted popcorn and pretzels. Onion salt, garlic salt, seasoned salt, table salt, and sea salt. Worcestershire sauce. Tartar sauce. Barbecue sauce. Teriyaki sauce. Soy sauce, including reduced-sodium. Steak sauce. Canned and packaged gravies. Fish sauce. Oyster sauce. Cocktail sauce. Horseradish that you find on the shelf. Ketchup. Mustard. Meat flavorings and tenderizers. Bouillon cubes. Hot sauce and Tabasco sauce. Premade or packaged marinades. Premade or packaged taco seasonings. Relishes. Regular salad dressings. Where to find more information:  National Heart, Lung, and Blood Institute: www.nhlbi.nih.gov  American Heart Association: www.heart.org Summary  The DASH eating plan is a healthy eating plan that has been shown to reduce high blood pressure (hypertension). It may also reduce your risk for type 2 diabetes, heart disease, and stroke.  With the DASH eating plan, you should limit salt (sodium) intake to 2,300 mg a day. If you have hypertension, you may need to reduce your sodium intake to 1,500 mg a day.  When on the DASH eating plan, aim to eat more fresh fruits and vegetables, whole grains, lean proteins, low-fat dairy, and heart-healthy fats.  Work with your health care provider or diet and nutrition specialist (dietitian) to adjust your eating plan to your individual   calorie needs. This information is not intended to replace advice given to you by your health care provider. Make sure you discuss any questions you have with your health care provider. Document Released: 04/24/2011 Document Revised: 04/28/2016 Document Reviewed: 04/28/2016 Elsevier Interactive Patient Education  2017 [employer]  

## 2016-08-28 NOTE — Telephone Encounter (Signed)
Tried to call with no answer  

## 2016-08-28 NOTE — Telephone Encounter (Signed)
Reminder in epic °

## 2016-08-28 NOTE — Telephone Encounter (Signed)
Pt is aware of results. 

## 2016-09-10 ENCOUNTER — Ambulatory Visit (HOSPITAL_COMMUNITY)
Admission: RE | Admit: 2016-09-10 | Discharge: 2016-09-10 | Disposition: A | Discharge status: Home / Self Care | Payer: BLUE CROSS/BLUE SHIELD | Source: Ambulatory Visit | Attending: Cardiovascular Disease | Admitting: Cardiovascular Disease

## 2016-09-10 ENCOUNTER — Encounter: Payer: Self-pay | Admitting: Family Medicine

## 2016-09-10 DIAGNOSIS — I517 Cardiomegaly: Secondary | ICD-10-CM | POA: Diagnosis not present

## 2016-09-10 DIAGNOSIS — I351 Nonrheumatic aortic (valve) insufficiency: Secondary | ICD-10-CM | POA: Insufficient documentation

## 2016-09-10 DIAGNOSIS — I359 Nonrheumatic aortic valve disorder, unspecified: Secondary | ICD-10-CM | POA: Insufficient documentation

## 2016-09-10 DIAGNOSIS — R011 Cardiac murmur, unspecified: Secondary | ICD-10-CM | POA: Insufficient documentation

## 2016-09-10 NOTE — Progress Notes (Signed)
*  PRELIMINARY RESULTS* Echocardiogram 2D Echocardiogram has been performed.  Leavy Cella 09/10/2016, 12:07 PM

## 2016-09-16 ENCOUNTER — Ambulatory Visit (INDEPENDENT_AMBULATORY_CARE_PROVIDER_SITE_OTHER): Payer: BLUE CROSS/BLUE SHIELD | Admitting: Family Medicine

## 2016-09-16 ENCOUNTER — Encounter: Payer: Self-pay | Admitting: Family Medicine

## 2016-09-16 VITALS — BP 220/110 | Ht 69.0 in | Wt 222.4 lb

## 2016-09-16 DIAGNOSIS — I10 Essential (primary) hypertension: Secondary | ICD-10-CM | POA: Diagnosis not present

## 2016-09-16 MED ORDER — AMLODIPINE BESYLATE 5 MG PO TABS: 5.0000 mg | ORAL_TABLET | Freq: Every day | ORAL | 5 refills | Status: DC

## 2016-09-16 NOTE — Progress Notes (Signed)
   Subjective:    Patient ID: Fernando Vance, male    DOB: [birth year], 56 y.o.   MRN: [id number]  Hypertension  This is a chronic problem. The current episode started more than 1 year ago. Pertinent negatives include no chest pain.  This gentleman taking blood pressure medicine denies headache chest pain shortness of breath came in today for blood pressure check of his very elevated he states he is trying eat relatively healthy.    Review of Systems  Constitutional: Negative for activity change, appetite change and fatigue.  HENT: Negative for congestion.   Respiratory: Negative for cough.   Cardiovascular: Negative for chest pain.  Gastrointestinal: Negative for abdominal pain.  Endocrine: Negative for polydipsia and polyphagia.  Neurological: Negative for weakness.  Psychiatric/Behavioral: Negative for confusion.       Objective:   Physical Exam  Constitutional: He appears well-nourished. No distress.  Cardiovascular: Normal rate, regular rhythm and normal heart sounds.   No murmur heard. Pulmonary/Chest: Effort normal and breath sounds normal. No respiratory distress.  Musculoskeletal: He exhibits no edema.  Lymphadenopathy:    He has no cervical adenopathy.  Neurological: He is alert.  Psychiatric: His behavior is normal.  Vitals reviewed.         Assessment & Plan:  HTN poor control add medication amlodipine 5 mg daily continue lisinopril. Recheck patient in approximately 1 week for a courtesy blood pressure check and a follow-up office visit later this summer the importance of healthy diet regular physical activity and keeping blood pressure good control were discussed in detail in regards to lowering the risk of heart disease and stroke

## 2016-09-16 NOTE — Patient Instructions (Signed)
DASH Eating Plan DASH stands for "Dietary Approaches to Stop Hypertension." The DASH eating plan is a healthy eating plan that has been shown to reduce high blood pressure (hypertension). It may also reduce your risk for type 2 diabetes, heart disease, and stroke. The DASH eating plan may also help with weight loss. What are tips for following this plan? General guidelines  Avoid eating more than 2,300 mg (milligrams) of salt (sodium) a day. If you have hypertension, you may need to reduce your sodium intake to 1,500 mg a day.  Limit alcohol intake to no more than 1 drink a day for nonpregnant women and 2 drinks a day for men. One drink equals 12 oz of beer, 5 oz of wine, or 1 oz of hard liquor.  Work with your health care provider to maintain a healthy body weight or to lose weight. Ask what an ideal weight is for you.  Get at least 30 minutes of exercise that causes your heart to beat faster (aerobic exercise) most days of the week. Activities may include walking, swimming, or biking.  Work with your health care provider or diet and nutrition specialist (dietitian) to adjust your eating plan to your individual calorie needs. Reading food labels  Check food labels for the amount of sodium per serving. Choose foods with less than 5 percent of the Daily Value of sodium. Generally, foods with less than 300 mg of sodium per serving fit into this eating plan.  To find whole grains, look for the word "whole" as the first word in the ingredient list. Shopping  Buy products labeled as "low-sodium" or "no salt added."  Buy fresh foods. Avoid canned foods and premade or frozen meals. Cooking  Avoid adding salt when cooking. Use salt-free seasonings or herbs instead of table salt or sea salt. Check with your health care provider or pharmacist before using salt substitutes.  Do not fry foods. Cook foods using healthy methods such as baking, boiling, grilling, and broiling instead.  Cook with  heart-healthy oils, such as olive, canola, soybean, or sunflower oil. Meal planning   Eat a balanced diet that includes: ? 5 or more servings of fruits and vegetables each day. At each meal, try to fill half of your plate with fruits and vegetables. ? Up to 6-8 servings of whole grains each day. ? Less than 6 oz of lean meat, poultry, or fish each day. A 3-oz serving of meat is about the same size as a deck of cards. One egg equals 1 oz. ? 2 servings of low-fat dairy each day. ? A serving of nuts, seeds, or beans 5 times each week. ? Heart-healthy fats. Healthy fats called Omega-3 fatty acids are found in foods such as flaxseeds and coldwater fish, like sardines, salmon, and mackerel.  Limit how much you eat of the following: ? Canned or prepackaged foods. ? Food that is high in trans fat, such as fried foods. ? Food that is high in saturated fat, such as fatty meat. ? Sweets, desserts, sugary drinks, and other foods with added sugar. ? Full-fat dairy products.  Do not salt foods before eating.  Try to eat at least 2 vegetarian meals each week.  Eat more home-cooked food and less restaurant, buffet, and fast food.  When eating at a restaurant, ask that your food be prepared with less salt or no salt, if possible. What foods are recommended? The items listed may not be a complete list. Talk with your dietitian about what   dietary choices are best for you. Grains Whole-grain or whole-wheat bread. Whole-grain or whole-wheat pasta. Brown rice. Oatmeal. Quinoa. Bulgur. Whole-grain and low-sodium cereals. Pita bread. Low-fat, low-sodium crackers. Whole-wheat flour tortillas. Vegetables Fresh or frozen vegetables (raw, steamed, roasted, or grilled). Low-sodium or reduced-sodium tomato and vegetable juice. Low-sodium or reduced-sodium tomato sauce and tomato paste. Low-sodium or reduced-sodium canned vegetables. Fruits All fresh, dried, or frozen fruit. Canned fruit in natural juice (without  added sugar). Meat and other protein foods Skinless chicken or turkey. Ground chicken or turkey. Pork with fat trimmed off. Fish and seafood. Egg whites. Dried beans, peas, or lentils. Unsalted nuts, nut butters, and seeds. Unsalted canned beans. Lean cuts of beef with fat trimmed off. Low-sodium, lean deli meat. Dairy Low-fat (1%) or fat-free (skim) milk. Fat-free, low-fat, or reduced-fat cheeses. Nonfat, low-sodium ricotta or cottage cheese. Low-fat or nonfat yogurt. Low-fat, low-sodium cheese. Fats and oils Soft margarine without trans fats. Vegetable oil. Low-fat, reduced-fat, or light mayonnaise and salad dressings (reduced-sodium). Canola, safflower, olive, soybean, and sunflower oils. Avocado. Seasoning and other foods Herbs. Spices. Seasoning mixes without salt. Unsalted popcorn and pretzels. Fat-free sweets. What foods are not recommended? The items listed may not be a complete list. Talk with your dietitian about what dietary choices are best for you. Grains Baked goods made with fat, such as croissants, muffins, or some breads. Dry pasta or rice meal packs. Vegetables Creamed or fried vegetables. Vegetables in a cheese sauce. Regular canned vegetables (not low-sodium or reduced-sodium). Regular canned tomato sauce and paste (not low-sodium or reduced-sodium). Regular tomato and vegetable juice (not low-sodium or reduced-sodium). Pickles. Olives. Fruits Canned fruit in a light or heavy syrup. Fried fruit. Fruit in cream or butter sauce. Meat and other protein foods Fatty cuts of meat. Ribs. Fried meat. Bacon. Sausage. Bologna and other processed lunch meats. Salami. Fatback. Hotdogs. Bratwurst. Salted nuts and seeds. Canned beans with added salt. Canned or smoked fish. Whole eggs or egg yolks. Chicken or turkey with skin. Dairy Whole or 2% milk, cream, and half-and-half. Whole or full-fat cream cheese. Whole-fat or sweetened yogurt. Full-fat cheese. Nondairy creamers. Whipped toppings.  Processed cheese and cheese spreads. Fats and oils Butter. Stick margarine. Lard. Shortening. Ghee. Bacon fat. Tropical oils, such as coconut, palm kernel, or palm oil. Seasoning and other foods Salted popcorn and pretzels. Onion salt, garlic salt, seasoned salt, table salt, and sea salt. Worcestershire sauce. Tartar sauce. Barbecue sauce. Teriyaki sauce. Soy sauce, including reduced-sodium. Steak sauce. Canned and packaged gravies. Fish sauce. Oyster sauce. Cocktail sauce. Horseradish that you find on the shelf. Ketchup. Mustard. Meat flavorings and tenderizers. Bouillon cubes. Hot sauce and Tabasco sauce. Premade or packaged marinades. Premade or packaged taco seasonings. Relishes. Regular salad dressings. Where to find more information:  National Heart, Lung, and Blood Institute: www.nhlbi.nih.gov  American Heart Association: www.heart.org Summary  The DASH eating plan is a healthy eating plan that has been shown to reduce high blood pressure (hypertension). It may also reduce your risk for type 2 diabetes, heart disease, and stroke.  With the DASH eating plan, you should limit salt (sodium) intake to 2,300 mg a day. If you have hypertension, you may need to reduce your sodium intake to 1,500 mg a day.  When on the DASH eating plan, aim to eat more fresh fruits and vegetables, whole grains, lean proteins, low-fat dairy, and heart-healthy fats.  Work with your health care provider or diet and nutrition specialist (dietitian) to adjust your eating plan to your individual   calorie needs. This information is not intended to replace advice given to you by your health care provider. Make sure you discuss any questions you have with your health care provider. Document Released: 04/24/2011 Document Revised: 04/28/2016 Document Reviewed: 04/28/2016 Elsevier Interactive Patient Education  2017 [employer]  

## 2016-09-25 ENCOUNTER — Ambulatory Visit (INDEPENDENT_AMBULATORY_CARE_PROVIDER_SITE_OTHER): Payer: BLUE CROSS/BLUE SHIELD | Admitting: Family Medicine

## 2016-09-25 DIAGNOSIS — I10 Essential (primary) hypertension: Secondary | ICD-10-CM

## 2016-09-25 MED ORDER — AMLODIPINE BESYLATE 10 MG PO TABS: 10.0000 mg | ORAL_TABLET | Freq: Every day | ORAL | 5 refills | Status: DC

## 2016-09-25 NOTE — Patient Instructions (Signed)
DASH Eating Plan DASH stands for "Dietary Approaches to Stop Hypertension." The DASH eating plan is a healthy eating plan that has been shown to reduce high blood pressure (hypertension). It may also reduce your risk for type 2 diabetes, heart disease, and stroke. The DASH eating plan may also help with weight loss. What are tips for following this plan? General guidelines  Avoid eating more than 2,300 mg (milligrams) of salt (sodium) a day. If you have hypertension, you may need to reduce your sodium intake to 1,500 mg a day.  Limit alcohol intake to no more than 1 drink a day for nonpregnant women and 2 drinks a day for men. One drink equals 12 oz of beer, 5 oz of wine, or 1 oz of hard liquor.  Work with your health care provider to maintain a healthy body weight or to lose weight. Ask what an ideal weight is for you.  Get at least 30 minutes of exercise that causes your heart to beat faster (aerobic exercise) most days of the week. Activities may include walking, swimming, or biking.  Work with your health care provider or diet and nutrition specialist (dietitian) to adjust your eating plan to your individual calorie needs. Reading food labels  Check food labels for the amount of sodium per serving. Choose foods with less than 5 percent of the Daily Value of sodium. Generally, foods with less than 300 mg of sodium per serving fit into this eating plan.  To find whole grains, look for the word "whole" as the first word in the ingredient list. Shopping  Buy products labeled as "low-sodium" or "no salt added."  Buy fresh foods. Avoid canned foods and premade or frozen meals. Cooking  Avoid adding salt when cooking. Use salt-free seasonings or herbs instead of table salt or sea salt. Check with your health care provider or pharmacist before using salt substitutes.  Do not fry foods. Cook foods using healthy methods such as baking, boiling, grilling, and broiling instead.  Cook with  heart-healthy oils, such as olive, canola, soybean, or sunflower oil. Meal planning   Eat a balanced diet that includes: ? 5 or more servings of fruits and vegetables each day. At each meal, try to fill half of your plate with fruits and vegetables. ? Up to 6-8 servings of whole grains each day. ? Less than 6 oz of lean meat, poultry, or fish each day. A 3-oz serving of meat is about the same size as a deck of cards. One egg equals 1 oz. ? 2 servings of low-fat dairy each day. ? A serving of nuts, seeds, or beans 5 times each week. ? Heart-healthy fats. Healthy fats called Omega-3 fatty acids are found in foods such as flaxseeds and coldwater fish, like sardines, salmon, and mackerel.  Limit how much you eat of the following: ? Canned or prepackaged foods. ? Food that is high in trans fat, such as fried foods. ? Food that is high in saturated fat, such as fatty meat. ? Sweets, desserts, sugary drinks, and other foods with added sugar. ? Full-fat dairy products.  Do not salt foods before eating.  Try to eat at least 2 vegetarian meals each week.  Eat more home-cooked food and less restaurant, buffet, and fast food.  When eating at a restaurant, ask that your food be prepared with less salt or no salt, if possible. What foods are recommended? The items listed may not be a complete list. Talk with your dietitian about what   dietary choices are best for you. Grains Whole-grain or whole-wheat bread. Whole-grain or whole-wheat pasta. Brown rice. Oatmeal. Quinoa. Bulgur. Whole-grain and low-sodium cereals. Pita bread. Low-fat, low-sodium crackers. Whole-wheat flour tortillas. Vegetables Fresh or frozen vegetables (raw, steamed, roasted, or grilled). Low-sodium or reduced-sodium tomato and vegetable juice. Low-sodium or reduced-sodium tomato sauce and tomato paste. Low-sodium or reduced-sodium canned vegetables. Fruits All fresh, dried, or frozen fruit. Canned fruit in natural juice (without  added sugar). Meat and other protein foods Skinless chicken or turkey. Ground chicken or turkey. Pork with fat trimmed off. Fish and seafood. Egg whites. Dried beans, peas, or lentils. Unsalted nuts, nut butters, and seeds. Unsalted canned beans. Lean cuts of beef with fat trimmed off. Low-sodium, lean deli meat. Dairy Low-fat (1%) or fat-free (skim) milk. Fat-free, low-fat, or reduced-fat cheeses. Nonfat, low-sodium ricotta or cottage cheese. Low-fat or nonfat yogurt. Low-fat, low-sodium cheese. Fats and oils Soft margarine without trans fats. Vegetable oil. Low-fat, reduced-fat, or light mayonnaise and salad dressings (reduced-sodium). Canola, safflower, olive, soybean, and sunflower oils. Avocado. Seasoning and other foods Herbs. Spices. Seasoning mixes without salt. Unsalted popcorn and pretzels. Fat-free sweets. What foods are not recommended? The items listed may not be a complete list. Talk with your dietitian about what dietary choices are best for you. Grains Baked goods made with fat, such as croissants, muffins, or some breads. Dry pasta or rice meal packs. Vegetables Creamed or fried vegetables. Vegetables in a cheese sauce. Regular canned vegetables (not low-sodium or reduced-sodium). Regular canned tomato sauce and paste (not low-sodium or reduced-sodium). Regular tomato and vegetable juice (not low-sodium or reduced-sodium). Pickles. Olives. Fruits Canned fruit in a light or heavy syrup. Fried fruit. Fruit in cream or butter sauce. Meat and other protein foods Fatty cuts of meat. Ribs. Fried meat. Bacon. Sausage. Bologna and other processed lunch meats. Salami. Fatback. Hotdogs. Bratwurst. Salted nuts and seeds. Canned beans with added salt. Canned or smoked fish. Whole eggs or egg yolks. Chicken or turkey with skin. Dairy Whole or 2% milk, cream, and half-and-half. Whole or full-fat cream cheese. Whole-fat or sweetened yogurt. Full-fat cheese. Nondairy creamers. Whipped toppings.  Processed cheese and cheese spreads. Fats and oils Butter. Stick margarine. Lard. Shortening. Ghee. Bacon fat. Tropical oils, such as coconut, palm kernel, or palm oil. Seasoning and other foods Salted popcorn and pretzels. Onion salt, garlic salt, seasoned salt, table salt, and sea salt. Worcestershire sauce. Tartar sauce. Barbecue sauce. Teriyaki sauce. Soy sauce, including reduced-sodium. Steak sauce. Canned and packaged gravies. Fish sauce. Oyster sauce. Cocktail sauce. Horseradish that you find on the shelf. Ketchup. Mustard. Meat flavorings and tenderizers. Bouillon cubes. Hot sauce and Tabasco sauce. Premade or packaged marinades. Premade or packaged taco seasonings. Relishes. Regular salad dressings. Where to find more information:  National Heart, Lung, and Blood Institute: www.nhlbi.nih.gov  American Heart Association: www.heart.org Summary  The DASH eating plan is a healthy eating plan that has been shown to reduce high blood pressure (hypertension). It may also reduce your risk for type 2 diabetes, heart disease, and stroke.  With the DASH eating plan, you should limit salt (sodium) intake to 2,300 mg a day. If you have hypertension, you may need to reduce your sodium intake to 1,500 mg a day.  When on the DASH eating plan, aim to eat more fresh fruits and vegetables, whole grains, lean proteins, low-fat dairy, and heart-healthy fats.  Work with your health care provider or diet and nutrition specialist (dietitian) to adjust your eating plan to your individual   calorie needs. This information is not intended to replace advice given to you by your health care provider. Make sure you discuss any questions you have with your health care provider. Document Released: 04/24/2011 Document Revised: 04/28/2016 Document Reviewed: 04/28/2016 Elsevier Interactive Patient Education  2017 [employer]  

## 2016-09-25 NOTE — Progress Notes (Signed)
   Subjective:    Patient ID: Fernando Vance, male    DOB: 1961/02/26, 56 y.o.   MRN: [id number]  Hypertension  This is a chronic problem. The current episode started more than 1 year ago.  He is watching his diet denies any chest tightness pressure pain shortness of breath denies problems with medicine  Patient states no other concerns this visit.  Review of Systems     Objective:   Physical Exam  Lungs clear heart regular blood pressure 158/84      Assessment & Plan:  HTN-increase amlodipine you dose 10 mg patient should follow-up in approximately 5-6 weeks.

## 2016-10-23 ENCOUNTER — Ambulatory Visit: Payer: BLUE CROSS/BLUE SHIELD | Admitting: Cardiovascular Disease

## 2016-10-27 DIAGNOSIS — E1169 Type 2 diabetes mellitus with other specified complication: Secondary | ICD-10-CM | POA: Diagnosis not present

## 2016-10-27 DIAGNOSIS — I10 Essential (primary) hypertension: Secondary | ICD-10-CM | POA: Diagnosis not present

## 2016-10-27 DIAGNOSIS — E785 Hyperlipidemia, unspecified: Secondary | ICD-10-CM | POA: Diagnosis not present

## 2016-10-27 DIAGNOSIS — E118 Type 2 diabetes mellitus with unspecified complications: Secondary | ICD-10-CM | POA: Diagnosis not present

## 2016-10-28 LAB — BASIC METABOLIC PANEL
BUN / CREAT RATIO: 16 (ref 9–20)
BUN: 18 mg/dL (ref 6–24)
CALCIUM: 9.5 mg/dL (ref 8.7–10.2)
CHLORIDE: 101 mmol/L (ref 96–106)
CO2: 21 mmol/L (ref 20–29)
Creatinine, Ser: 1.13 mg/dL (ref 0.76–1.27)
GFR, EST AFRICAN AMERICAN: 85 mL/min/{1.73_m2} (ref >59)
GFR, EST NON AFRICAN AMERICAN: 73 mL/min/{1.73_m2} (ref >59)
Glucose: 122 mg/dL — ABNORMAL HIGH (ref 65–99)
Potassium: 4.6 mmol/L (ref 3.5–5.2)
Sodium: 139 mmol/L (ref 134–144)

## 2016-10-28 LAB — HEPATIC FUNCTION PANEL
ALBUMIN: 5 g/dL (ref 3.5–5.5)
ALK PHOS: 87 IU/L (ref 39–117)
ALT: 17 IU/L (ref 0–44)
AST: 17 IU/L (ref 0–40)
BILIRUBIN TOTAL: 0.8 mg/dL (ref 0.0–1.2)
BILIRUBIN, DIRECT: 0.17 mg/dL (ref 0.00–0.40)
TOTAL PROTEIN: 8.2 g/dL (ref 6.0–8.5)

## 2016-10-28 LAB — LIPID PANEL
CHOLESTEROL TOTAL: 200 mg/dL — AB (ref 100–199)
Chol/HDL Ratio: 4.5 ratio (ref 0.0–5.0)
HDL: 44 mg/dL (ref >39)
LDL Calculated: 125 mg/dL — ABNORMAL HIGH (ref 0–99)
Triglycerides: 156 mg/dL — ABNORMAL HIGH (ref 0–149)
VLDL CHOLESTEROL CAL: 31 mg/dL (ref 5–40)

## 2016-10-28 LAB — HEMOGLOBIN A1C
Est. average glucose Bld gHb Est-mCnc: 192 mg/dL
Hgb A1c MFr Bld: 8.3 % — ABNORMAL HIGH (ref 4.8–5.6)

## 2016-10-31 ENCOUNTER — Ambulatory Visit (INDEPENDENT_AMBULATORY_CARE_PROVIDER_SITE_OTHER): Payer: BLUE CROSS/BLUE SHIELD | Admitting: Adult Health

## 2016-10-31 ENCOUNTER — Ambulatory Visit (INDEPENDENT_AMBULATORY_CARE_PROVIDER_SITE_OTHER): Payer: BLUE CROSS/BLUE SHIELD | Admitting: Family Medicine

## 2016-10-31 ENCOUNTER — Other Ambulatory Visit: Payer: Self-pay | Admitting: *Deleted

## 2016-10-31 ENCOUNTER — Encounter: Payer: Self-pay | Admitting: Adult Health

## 2016-10-31 ENCOUNTER — Encounter: Payer: Self-pay | Admitting: Family Medicine

## 2016-10-31 VITALS — BP 198/98 | HR 99 | Ht 70.0 in | Wt 218.0 lb

## 2016-10-31 VITALS — BP 130/86 | Ht 69.0 in | Wt 218.0 lb

## 2016-10-31 DIAGNOSIS — I10 Essential (primary) hypertension: Secondary | ICD-10-CM

## 2016-10-31 DIAGNOSIS — E1169 Type 2 diabetes mellitus with other specified complication: Secondary | ICD-10-CM | POA: Diagnosis not present

## 2016-10-31 DIAGNOSIS — I359 Nonrheumatic aortic valve disorder, unspecified: Secondary | ICD-10-CM

## 2016-10-31 DIAGNOSIS — E785 Hyperlipidemia, unspecified: Secondary | ICD-10-CM | POA: Diagnosis not present

## 2016-10-31 MED ORDER — ATORVASTATIN CALCIUM 20 MG PO TABS: 20.0000 mg | ORAL_TABLET | Freq: Every day | ORAL | 5 refills | Status: DC

## 2016-10-31 MED ORDER — GLIPIZIDE ER 5 MG PO TB24: 5.0000 mg | ORAL_TABLET | Freq: Every day | ORAL | 5 refills | Status: DC

## 2016-10-31 NOTE — Patient Instructions (Signed)
Diabetes Mellitus and Food It is important for you to manage your blood sugar (glucose) level. Your blood glucose level can be greatly affected by what you eat. Eating healthier foods in the appropriate amounts throughout the day at about the same time each day will help you control your blood glucose level. It can also help slow or prevent worsening of your diabetes mellitus. Healthy eating may even help you improve the level of your blood pressure and reach or maintain a healthy weight. General recommendations for healthful eating and cooking habits include:  Eating meals and snacks regularly. Avoid going long periods of time without eating to lose weight.  Eating a diet that consists mainly of plant-based foods, such as fruits, vegetables, nuts, legumes, and whole grains.  Using low-heat cooking methods, such as baking, instead of high-heat cooking methods, such as deep frying.  Work with your dietitian to make sure you understand how to use the Nutrition Facts information on food labels. How can food affect me? Carbohydrates Carbohydrates affect your blood glucose level more than any other type of food. Your dietitian will help you determine how many carbohydrates to eat at each meal and teach you how to count carbohydrates. Counting carbohydrates is important to keep your blood glucose at a healthy level, especially if you are using insulin or taking certain medicines for diabetes mellitus. Alcohol Alcohol can cause sudden decreases in blood glucose (hypoglycemia), especially if you use insulin or take certain medicines for diabetes mellitus. Hypoglycemia can be a life-threatening condition. Symptoms of hypoglycemia (sleepiness, dizziness, and disorientation) are similar to symptoms of having too much alcohol. If your health care provider has given you approval to drink alcohol, do so in moderation and use the following guidelines:  Women should not have more than one drink per day, and men  should not have more than two drinks per day. One drink is equal to: ? 12 oz of beer. ? 5 oz of wine. ? 1 oz of hard liquor.  Do not drink on an empty stomach.  Keep yourself hydrated. Have water, diet soda, or unsweetened iced tea.  Regular soda, juice, and other mixers might contain a lot of carbohydrates and should be counted.  What foods are not recommended? As you make food choices, it is important to remember that all foods are not the same. Some foods have fewer nutrients per serving than other foods, even though they might have the same number of calories or carbohydrates. It is difficult to get your body what it needs when you eat foods with fewer nutrients. Examples of foods that you should avoid that are high in calories and carbohydrates but low in nutrients include:  Trans fats (most processed foods list trans fats on the Nutrition Facts label).  Regular soda.  Juice.  Candy.  Sweets, such as cake, pie, doughnuts, and cookies.  Fried foods.  What foods can I eat? Eat nutrient-rich foods, which will nourish your body and keep you healthy. The food you should eat also will depend on several factors, including:  The calories you need.  The medicines you take.  Your weight.  Your blood glucose level.  Your blood pressure level.  Your cholesterol level.  You should eat a variety of foods, including:  Protein. ? Lean cuts of meat. ? Proteins low in saturated fats, such as fish, egg whites, and beans. Avoid processed meats.  Fruits and vegetables. ? Fruits and vegetables that may help control blood glucose levels, such as apples,   mangoes, and yams.  Dairy products. ? Choose fat-free or low-fat dairy products, such as milk, yogurt, and cheese.  Grains, bread, pasta, and rice. ? Choose whole grain products, such as multigrain bread, whole oats, and brown rice. These foods may help control blood pressure.  Fats. ? Foods containing healthful fats, such as  nuts, avocado, olive oil, canola oil, and fish.  Does everyone with diabetes mellitus have the same meal plan? Because every person with diabetes mellitus is different, there is not one meal plan that works for everyone. It is very important that you meet with a dietitian who will help you create a meal plan that is just right for you. This information is not intended to replace advice given to you by your health care provider. Make sure you discuss any questions you have with your health care provider. Document Released: 01/30/2005 Document Revised: 10/11/2015 Document Reviewed: 04/01/2013 Elsevier Interactive Patient Education  2017 [employer] Inc.  

## 2016-10-31 NOTE — Progress Notes (Signed)
   Subjective:    Patient ID: Fernando Vance, male    DOB: 1961-03-20, 56 y.o.   MRN: [id number]  Hypertension  This is a chronic problem. The current episode started more than 1 year ago. Pertinent negatives include no chest pain.  Patient states he does not check his blood pressure outside the office He is trying to watch his diet  Patient in today for a 5 week follow up on Hypertension. Patient's Amlodipine was increased on last visit to 10 mg.   Patient states no other concerns this visit.  Patient takes his cholesterol medicine on a regular basis try to watch this as well He also takes his diabetes medicine but states he could do a little bit better on diet. Denies excessive thirst or urination. Review of Systems  Constitutional: Negative for activity change, appetite change and fatigue.  HENT: Negative for congestion.   Respiratory: Negative for cough.   Cardiovascular: Negative for chest pain.  Gastrointestinal: Negative for abdominal pain.  Endocrine: Negative for polydipsia and polyphagia.  Neurological: Negative for weakness.  Psychiatric/Behavioral: Negative for confusion.       Objective:   Physical Exam  Constitutional: He appears well-nourished. No distress.  Cardiovascular: Normal rate and regular rhythm.   Murmur heard. Pulmonary/Chest: Effort normal and breath sounds normal. No respiratory distress.  Musculoskeletal: He exhibits no edema.  Lymphadenopathy:    He has no cervical adenopathy.  Neurological: He is alert.  Psychiatric: His behavior is normal.  Vitals reviewed.  Patient has mild murmur regular rhythm       Assessment & Plan:  HTN-initially blood pressure was good on recheck it was elevated continue current measures watch salt diet check blood pressure outside the office proper parameters were discussed  Diabetes subpar control add glipizide warnings regarding low blood sugars were discussed additional information regarding diet was given  patient is check sugars on intermittent basis over the next few weeks and send this back to [place]  Hyperlipidemia adjust Lipitor to try to get LDL below 70  Patient was encouraged to follow-up with [place] in approximately 4 months

## 2016-10-31 NOTE — Patient Instructions (Signed)
Medication Instructions:   Your physician recommends that you continue on your current medications as directed. Please refer to the Current Medication list given to you today.  Labwork:  NONE  Testing/Procedures:  NONE  Follow-Up:  Your physician recommends that you schedule a follow-up appointment in: 3 months.  Any Other Special Instructions Will Be Listed Below (If Applicable).  Please get a home blood pressure monitor.  Please check your blood pressure in the morning and evening and record them.  Please use the same arm and check your blood pressure around the same times.  Call our office next week with your BP readings.  If you need a refill on your cardiac medications before your next appointment, please call your pharmacy.

## 2016-10-31 NOTE — Progress Notes (Signed)
Cardiology Office Note   Date:  10/31/2016   ID:  Fernando Vance, DOB [birth year], MRN [id number]  PCP:  Fernando Drown, MD  Cardiologist:  Fernando Vance  Chief Complaint  Patient presents with  . Hypertension      History of Present Illness: Fernando Vance is a 56 y.o. male who presents for ongoing assessment and management of hypertension, hyperlipidemia, who was last seen by Dr. Bronson Vance on 08/27/2016 in the setting of cardiac murmur. EKG revealed LVH with diffuse significant repolarization abnormalities. Repeat echocardiogram was ordered. Lisinopril was increased to 10 mg daily in the setting of markedly elevated blood pressure.   Echocardiogram 09/10/2016 Left ventricle: The cavity size was normal. Wall thickness was   increased in a pattern of moderate LVH. Systolic function was   normal. The estimated ejection fraction was in the range of 55%   to 60%. Wall motion was normal; there were no regional wall   motion abnormalities. Left ventricular diastolic function   parameters were normal. - Aortic valve: Mildly calcified annulus. Trileaflet; mildly   thickened, mildly calcified leaflets. There was very mild   stenosis. There was trivial regurgitation. Peak velocity (S): 229   cm/s. Mean gradient (S): 9 mm Hg. Valve area (VTI): 1.83 cm^2.   Valve area (Vmax): 1.81 cm^2. Valve area (Vmean): 1.96 cm^2.  He is here today for follow-up. Has been seen by primary care physician Dr. Wolfgang Vance, this a.m. for blood pressure check as well. Had been seen by Dr. Wolfgang Vance,  after being seen by our office and was found to be hypertensive and had increased dose of amlodipine to 10 mg. This a.m. in Dr. Despina Vance office blood pressure was 130/86. Since being seen this morning the patient has had some caffeine any lunch, and considered a hypertensive. Blood pressure 198/98, heart rate 99 bpm.  The patient states he always gets nervous coming to cardiology office. He has taken his medicine today,  however he has had 2 cups of coffee and eaten lunch. Some of which has been consulted.   Past Medical History:  Diagnosis Date  . Diabetes mellitus without complication ([place])   . Hypertension     Past Surgical History:  Procedure Laterality Date  . COLONOSCOPY N/A 08/18/2016   Procedure: COLONOSCOPY;  Surgeon: Fernando Binder, MD;  [facility];  Service: Endoscopy;  Laterality: N/A;  1030   . POLYPECTOMY  08/18/2016   Procedure: POLYPECTOMY;  Surgeon: Fernando Binder, MD;  Location: [facility];  Service: Endoscopy;;  sigmoid  . WISDOM TOOTH EXTRACTION     20 years ago     Current Outpatient Prescriptions  Medication Sig Dispense Refill  . amLODipine (NORVASC) 10 MG tablet Take 1 tablet (10 mg total) by mouth daily. 30 tablet 5  . atorvastatin (LIPITOR) 20 MG tablet Take 1 tablet (20 mg total) by mouth daily. 30 tablet 5  . glipiZIDE (GLIPIZIDE XL) 5 MG 24 hr tablet Take 1 tablet (5 mg total) by mouth daily with breakfast. 30 tablet 5  . lisinopril (PRINIVIL,ZESTRIL) 10 MG tablet Take 1 tablet (10 mg total) by mouth daily. 90 tablet 3  . metFORMIN (GLUCOPHAGE) 500 MG tablet Take 1 tablet (500 mg total) by mouth 2 (two) times daily with a meal. 60 tablet 4   No current facility-administered medications for this visit.     Allergies:   Patient has no known allergies.    Social History:  The patient  reports that he has never  smoked. He has never used smokeless tobacco. He reports that he does not drink alcohol or use drugs.   Family History:  The patient's family history includes Diabetes in his mother; Heart failure in his father.    ROS: All other systems are reviewed and negative. Unless otherwise mentioned in H&P    PHYSICAL EXAM: VS:  BP (!) 198/98   Pulse 99   Ht 5\' 10"  (1.778 m)   Wt 218 lb (98.9 kg)   SpO2 95%   BMI 31.28 kg/m  , BMI Body mass index is 31.28 kg/m. GEN: Well nourished, well developed, in no acute distress  HEENT: normal  Neck: no  JVD, carotid bruits, or masses Cardiac: RRR; tachycardic, no murmurs, rubs, or gallops,no edema  Respiratory:  clear to auscultation bilaterally, normal work of breathing GI: soft, nontender, nondistended, + BS MS: no deformity or atrophy  Skin: warm and dry, no rash Neuro:  Strength and sensation are intact Psych: euthymic mood, full affect  Recent Labs: 07/11/2016: Hemoglobin 12.5; Platelets 358 10/27/2016: ALT 17; BUN 18; Creatinine, Ser 1.13; Potassium 4.6; Sodium 139    Lipid Panel    Component Value Date/Time   CHOL 200 (H) 10/27/2016 1247   TRIG 156 (H) 10/27/2016 1247   HDL 44 10/27/2016 1247   CHOLHDL 4.5 10/27/2016 1247   LDLCALC 125 (H) 10/27/2016 1247      Wt Readings from Last 3 Encounters:  10/31/16 218 lb (98.9 kg)  10/31/16 218 lb (98.9 kg)  09/16/16 222 lb 6 oz (100.9 kg)      ASSESSMENT AND PLAN:  1.  Difficult to control hypertension: Was seen by PCP this a.m. with normal blood pressure. However, this afternoon blood pressure is elevated. He has taken his medications today but has had some caffeine and eaten lunch with some salty food.  I've advised him take his blood pressure twice a day in the morning and in the afternoon around the same time each day and record it. If blood pressure remains elevated in the afternoon may need to add low dose HCTZ at lunch time. He takes best his medications in the morning, may need to adjust timing of medications for better coverage throughout the day. We'll see him back in 3 months but he is to call us for his blood pressure recordings next week.. This is been reinforced by RN on AVS.. He is to continue amlodipine 10 mg and lisinopril 20 mg daily.  2. Diabetes: Followed by PCP.   Current medicines are reviewed at length with the patient today.    Labs/ tests ordered today include:  Fernando Vance. Fernando Vance, ANP, AACC   10/31/2016 3:31 PM    [name] [address], [place], [place]  [address] Phone: [phone]; Fax: [phone]

## 2016-11-20 ENCOUNTER — Other Ambulatory Visit: Payer: Self-pay | Admitting: Family Medicine

## 2017-02-03 ENCOUNTER — Encounter: Payer: Self-pay | Admitting: Cardiovascular Disease

## 2017-02-03 ENCOUNTER — Ambulatory Visit (INDEPENDENT_AMBULATORY_CARE_PROVIDER_SITE_OTHER): Payer: BLUE CROSS/BLUE SHIELD | Admitting: Cardiovascular Disease

## 2017-02-03 VITALS — BP 162/84 | HR 109 | Ht 68.0 in | Wt 228.0 lb

## 2017-02-03 DIAGNOSIS — I350 Nonrheumatic aortic (valve) stenosis: Secondary | ICD-10-CM | POA: Diagnosis not present

## 2017-02-03 DIAGNOSIS — I517 Cardiomegaly: Secondary | ICD-10-CM | POA: Diagnosis not present

## 2017-02-03 DIAGNOSIS — I10 Essential (primary) hypertension: Secondary | ICD-10-CM | POA: Diagnosis not present

## 2017-02-03 DIAGNOSIS — E785 Hyperlipidemia, unspecified: Secondary | ICD-10-CM | POA: Diagnosis not present

## 2017-02-03 MED ORDER — LISINOPRIL 20 MG PO TABS: 20.0000 mg | ORAL_TABLET | Freq: Every day | ORAL | 3 refills | Status: DC

## 2017-02-03 NOTE — Patient Instructions (Signed)
Your physician wants you to follow-up in: 1 year with Dr.Koneswaran You will receive a reminder letter in the mail two months in advance. If you don't receive a letter, please call our office to schedule the follow-up appointment.    INCREASE Lisinopril to 20 mg daily    Keep Blood Pressure, record readings three times a week for 1 month and then drop of at office for MD to review.      No lab work or tests ordered  Today       Thank you for choosing [facility] !

## 2017-02-03 NOTE — Progress Notes (Signed)
SUBJECTIVE: The patient presents for routine follow-up. Echocardiogram demonstrated normal left ventricular systolic function, moderate LVH, normal diastolic function, and very mild aortic stenosis with a peak velocity of 2.29 m/s and a mean gradient of 9 mmHg.  The patient denies any symptoms of chest pain, palpitations, shortness of breath, lightheadedness, dizziness, leg swelling, orthopnea, PND, and syncope.  He has worked at [employer] for 19 years in [place] and has not missed a day of work. He was given a tshirt and treated to a steak dinner for this.   Review of Systems: As per "subjective", otherwise negative.  No Known Allergies  Current Outpatient Prescriptions  Medication Sig Dispense Refill  . amLODipine (NORVASC) 10 MG tablet Take 1 tablet (10 mg total) by mouth daily. 30 tablet 5  . atorvastatin (LIPITOR) 20 MG tablet Take 1 tablet (20 mg total) by mouth daily. 30 tablet 5  . glipiZIDE (GLIPIZIDE XL) 5 MG 24 hr tablet Take 1 tablet (5 mg total) by mouth daily with breakfast. 30 tablet 5  . lisinopril (PRINIVIL,ZESTRIL) 10 MG tablet Take 1 tablet (10 mg total) by mouth daily. 90 tablet 3  . metFORMIN (GLUCOPHAGE) 500 MG tablet TAKE 1 TABLET BY MOUTH TWO TIMES DAILY WITH A MEAL. 60 tablet 5   No current facility-administered medications for this visit.     Past Medical History:  Diagnosis Date  . Diabetes mellitus without complication ([place])   . Hypertension     Past Surgical History:  Procedure Laterality Date  . COLONOSCOPY N/A 08/18/2016   Procedure: COLONOSCOPY;  Surgeon: Danie Binder, MD;  [facility];  Service: Endoscopy;  Laterality: N/A;  1030   . POLYPECTOMY  08/18/2016   Procedure: POLYPECTOMY;  Surgeon: Danie Binder, MD;  [facility];  Service: Endoscopy;;  sigmoid  . WISDOM TOOTH EXTRACTION     20 years ago    Social History   Social History  . Marital status: Married    Spouse name: N/A  . Number of children: N/A    . Years of education: N/A   Occupational History  . Not on file.   Social History Main Topics  . Smoking status: Never Smoker  . Smokeless tobacco: Never Used  . Alcohol use No  . Drug use: No  . Sexual activity: Not on file   Other Topics Concern  . Not on file   Social History Narrative  . No narrative on file     Vitals:   02/03/17 0817  BP: (!) 162/84  Pulse: (!) 109  SpO2: 98%  Weight: 228 lb (103.4 kg)  Height: 5\' 8"  (1.727 m)    Wt Readings from Last 3 Encounters:  02/03/17 228 lb (103.4 kg)  10/31/16 218 lb (98.9 kg)  10/31/16 218 lb (98.9 kg)     PHYSICAL EXAM General: NAD HEENT: Normal. Neck: No JVD, no thyromegaly. Lungs: Clear to auscultation bilaterally with normal respiratory effort. CV: Nondisplaced PMI.  Regular rate and rhythm, normal S1/S2, no C9/O7, 3/6 systolic murmur heard throughout precordium. No pretibial or periankle edema.  No carotid bruit.   Abdomen: Soft, nontender, no distention.  Neurologic: Alert and oriented.  Psych: Normal affect. Skin: Normal. Musculoskeletal: No gross deformities.    ECG: Most recent ECG reviewed.   Labs: Lab Results  Component Value Date/Time   K 4.6 10/27/2016 12:47 PM   BUN 18 10/27/2016 12:47 PM   CREATININE 1.13 10/27/2016 12:47 PM   ALT 17  10/27/2016 12:47 PM   HGB 12.5 (L) 07/11/2016 11:24 AM     Lipids: Lab Results  Component Value Date/Time   LDLCALC 125 (H) 10/27/2016 12:47 PM   CHOL 200 (H) 10/27/2016 12:47 PM   TRIG 156 (H) 10/27/2016 12:47 PM   HDL 44 10/27/2016 12:47 PM       ASSESSMENT AND PLAN:  1. Very mild aortic stenosis:  asymptomatic. I will monitor clinically and with surveillance echocardiography as needed.   2. Hypertension: Markedly elevated. I will increase lisinopril to 20 mg daily. I have asked the patient to check blood pressure readings 3-4 times per week, at different times throughout the day, in order to get a better approximation of mean BP values.  These results will be provided to me at the end of that period so that I can determine if antihypertensive medication titration is indicated.  3. Hyperlipidemia: Continue Lipitor.     Disposition: Follow up 1 year.   Kate Sable, M.D., F.A.C.C.

## 2017-03-02 ENCOUNTER — Ambulatory Visit: Payer: BLUE CROSS/BLUE SHIELD | Admitting: Family Medicine

## 2017-03-10 ENCOUNTER — Telehealth: Payer: Self-pay | Admitting: Family Medicine

## 2017-03-10 DIAGNOSIS — E1169 Type 2 diabetes mellitus with other specified complication: Secondary | ICD-10-CM

## 2017-03-10 DIAGNOSIS — I10 Essential (primary) hypertension: Secondary | ICD-10-CM

## 2017-03-10 DIAGNOSIS — E785 Hyperlipidemia, unspecified: Secondary | ICD-10-CM

## 2017-03-10 DIAGNOSIS — D649 Anemia, unspecified: Secondary | ICD-10-CM

## 2017-03-10 DIAGNOSIS — E118 Type 2 diabetes mellitus with unspecified complications: Secondary | ICD-10-CM

## 2017-03-10 NOTE — Telephone Encounter (Signed)
,  lipid, liver, metabolic 7, CBC, [id number], hyperlipidemia, hypertension, anemia

## 2017-03-10 NOTE — Telephone Encounter (Signed)
Patient is requesting orders for labs.  He has an appointment on 03/16/17 with Dr. Nicki Reaper.

## 2017-03-10 NOTE — Telephone Encounter (Signed)
Patient had Lipid, Liver, Met 7 and HgbA1c on 10/27/16

## 2017-03-11 NOTE — Telephone Encounter (Signed)
Blood work ordered in EPIC. Patient notified. 

## 2017-03-12 DIAGNOSIS — E118 Type 2 diabetes mellitus with unspecified complications: Secondary | ICD-10-CM | POA: Diagnosis not present

## 2017-03-12 DIAGNOSIS — E785 Hyperlipidemia, unspecified: Secondary | ICD-10-CM | POA: Diagnosis not present

## 2017-03-12 DIAGNOSIS — D649 Anemia, unspecified: Secondary | ICD-10-CM | POA: Diagnosis not present

## 2017-03-12 DIAGNOSIS — E1169 Type 2 diabetes mellitus with other specified complication: Secondary | ICD-10-CM | POA: Diagnosis not present

## 2017-03-12 DIAGNOSIS — I10 Essential (primary) hypertension: Secondary | ICD-10-CM | POA: Diagnosis not present

## 2017-03-13 LAB — CBC WITH DIFFERENTIAL/PLATELET
BASOS: 0 %
Basophils Absolute: 0 10*3/uL (ref 0.0–0.2)
EOS (ABSOLUTE): 0.1 10*3/uL (ref 0.0–0.4)
EOS: 2 %
Hematocrit: 37.8 % (ref 37.5–51.0)
Hemoglobin: 12.1 g/dL — ABNORMAL LOW (ref 13.0–17.7)
IMMATURE GRANS (ABS): 0 10*3/uL (ref 0.0–0.1)
IMMATURE GRANULOCYTES: 0 %
Lymphocytes Absolute: 2.2 10*3/uL (ref 0.7–3.1)
Lymphs: 44 %
MCH: 22.5 pg — AB (ref 26.6–33.0)
MCHC: 32 g/dL (ref 31.5–35.7)
MCV: 70 fL — AB (ref 79–97)
MONOS ABS: 0.4 10*3/uL (ref 0.1–0.9)
Monocytes: 8 %
NEUTROS ABS: 2.3 10*3/uL (ref 1.4–7.0)
Neutrophils: 46 %
Platelets: 337 10*3/uL (ref 150–379)
RBC: 5.38 x10E6/uL (ref 4.14–5.80)
RDW: 16.2 % — ABNORMAL HIGH (ref 12.3–15.4)
WBC: 5 10*3/uL (ref 3.4–10.8)

## 2017-03-13 LAB — HEMOGLOBIN A1C
Est. average glucose Bld gHb Est-mCnc: 157 mg/dL
Hgb A1c MFr Bld: 7.1 % — ABNORMAL HIGH (ref 4.8–5.6)

## 2017-03-13 LAB — BASIC METABOLIC PANEL
BUN / CREAT RATIO: 14 (ref 9–20)
BUN: 15 mg/dL (ref 6–24)
CHLORIDE: 103 mmol/L (ref 96–106)
CO2: 23 mmol/L (ref 20–29)
Calcium: 9.2 mg/dL (ref 8.7–10.2)
Creatinine, Ser: 1.05 mg/dL (ref 0.76–1.27)
GFR calc non Af Amer: 80 mL/min/{1.73_m2} (ref >59)
GFR, EST AFRICAN AMERICAN: 93 mL/min/{1.73_m2} (ref >59)
Glucose: 139 mg/dL — ABNORMAL HIGH (ref 65–99)
POTASSIUM: 4.6 mmol/L (ref 3.5–5.2)
SODIUM: 144 mmol/L (ref 134–144)

## 2017-03-13 LAB — HEPATIC FUNCTION PANEL
ALT: 17 IU/L (ref 0–44)
AST: 15 IU/L (ref 0–40)
Albumin: 5 g/dL (ref 3.5–5.5)
Alkaline Phosphatase: 91 IU/L (ref 39–117)
BILIRUBIN TOTAL: 0.5 mg/dL (ref 0.0–1.2)
BILIRUBIN, DIRECT: 0.12 mg/dL (ref 0.00–0.40)
Total Protein: 8.3 g/dL (ref 6.0–8.5)

## 2017-03-13 LAB — LIPID PANEL
CHOL/HDL RATIO: 4.6 ratio (ref 0.0–5.0)
CHOLESTEROL TOTAL: 199 mg/dL (ref 100–199)
HDL: 43 mg/dL (ref >39)
LDL CALC: 122 mg/dL — AB (ref 0–99)
TRIGLYCERIDES: 171 mg/dL — AB (ref 0–149)
VLDL Cholesterol Cal: 34 mg/dL (ref 5–40)

## 2017-03-16 ENCOUNTER — Encounter: Payer: Self-pay | Admitting: Family Medicine

## 2017-03-16 ENCOUNTER — Ambulatory Visit (INDEPENDENT_AMBULATORY_CARE_PROVIDER_SITE_OTHER): Payer: BLUE CROSS/BLUE SHIELD | Admitting: Family Medicine

## 2017-03-16 VITALS — BP 122/82 | Ht 68.0 in | Wt 226.2 lb

## 2017-03-16 DIAGNOSIS — E118 Type 2 diabetes mellitus with unspecified complications: Secondary | ICD-10-CM

## 2017-03-16 DIAGNOSIS — D369 Benign neoplasm, unspecified site: Secondary | ICD-10-CM | POA: Diagnosis not present

## 2017-03-16 DIAGNOSIS — E785 Hyperlipidemia, unspecified: Secondary | ICD-10-CM

## 2017-03-16 DIAGNOSIS — E1169 Type 2 diabetes mellitus with other specified complication: Secondary | ICD-10-CM | POA: Diagnosis not present

## 2017-03-16 DIAGNOSIS — I10 Essential (primary) hypertension: Secondary | ICD-10-CM | POA: Diagnosis not present

## 2017-03-16 DIAGNOSIS — D649 Anemia, unspecified: Secondary | ICD-10-CM

## 2017-03-16 DIAGNOSIS — Z23 Encounter for immunization: Secondary | ICD-10-CM

## 2017-03-16 DIAGNOSIS — Z125 Encounter for screening for malignant neoplasm of prostate: Secondary | ICD-10-CM

## 2017-03-16 MED ORDER — ATORVASTATIN CALCIUM 40 MG PO TABS: 40.0000 mg | ORAL_TABLET | Freq: Every day | ORAL | 5 refills | Status: DC

## 2017-03-16 MED ORDER — LISINOPRIL 40 MG PO TABS: 40.0000 mg | ORAL_TABLET | Freq: Every day | ORAL | 3 refills | Status: DC

## 2017-03-16 MED ORDER — AMLODIPINE BESYLATE 10 MG PO TABS: 10.0000 mg | ORAL_TABLET | Freq: Every day | ORAL | 5 refills | Status: DC

## 2017-03-16 MED ORDER — GLIPIZIDE ER 5 MG PO TB24: 5.0000 mg | ORAL_TABLET | Freq: Every day | ORAL | 5 refills | Status: DC

## 2017-03-16 MED ORDER — METFORMIN HCL ER (MOD) 500 MG PO TB24: ORAL_TABLET | ORAL | 5 refills | Status: DC

## 2017-03-16 MED ORDER — ATORVASTATIN CALCIUM 20 MG PO TABS: 20.0000 mg | ORAL_TABLET | Freq: Every day | ORAL | 5 refills | Status: DC

## 2017-03-16 NOTE — Progress Notes (Addendum)
Subjective:    Patient ID: Fernando Vance, male    DOB: 02-21-1961, 56 y.o.   MRN: [id number]  Diabetes  He presents for his follow-up diabetic visit. He has type 2 diabetes mellitus. Pertinent negatives for hypoglycemia include no confusion. Pertinent negatives for diabetes include no chest pain, no fatigue, no polydipsia, no polyphagia and no weakness. Risk factors for coronary artery disease include diabetes mellitus, dyslipidemia and hypertension. Current diabetic treatment includes oral agent (dual therapy). His weight is stable. He is following a diabetic diet. He has not had a previous visit with a dietitian. He does not see a podiatrist.Eye exam is not current.   Patient denies any low sugar spells.  States medication does cause significant diarrhea issues 25 minutes was spent with the patient. Greater than half the time was spent in discussion and answering questions and counseling regarding the issues that the patient came in for today.  Blood pressure he thinks is under decent control taking his medication recently the cardiologist increased his lisinopril new dose to 20 mg  He did have colonoscopy done he has some questions regarding the findings and explaining what it means  He does have anemia for which lab work showed stability but does not pinpoint the cause  He also has hyperlipidemia he takes his medication on a regular basis lab work showed LDL not quite under as good control as we would like   Review of Systems  Constitutional: Negative for activity change, appetite change and fatigue.  HENT: Negative for congestion.   Respiratory: Negative for cough.   Cardiovascular: Negative for chest pain.  Gastrointestinal: Negative for abdominal pain.  Endocrine: Negative for polydipsia and polyphagia.  Neurological: Negative for weakness.  Psychiatric/Behavioral: Negative for confusion.       Objective:   Physical Exam  Constitutional: He appears well-developed and  well-nourished.  HENT:  Head: Normocephalic and atraumatic.  Right Ear: External ear normal.  Left Ear: External ear normal.  Nose: Nose normal.  Mouth/Throat: Oropharynx is clear and moist.  Eyes: Pupils are equal, round, and reactive to light. EOM are normal.  Neck: Normal range of motion. Neck supple. No thyromegaly present.  Cardiovascular: Normal rate, regular rhythm and normal heart sounds.   No murmur heard. Pulmonary/Chest: Effort normal and breath sounds normal. No respiratory distress. He has no wheezes.  Abdominal: Soft. Bowel sounds are normal. He exhibits no distension and no mass. There is no tenderness.  Genitourinary: Penis normal.  Musculoskeletal: Normal range of motion. He exhibits no edema.  Lymphadenopathy:    He has no cervical adenopathy.  Neurological: He is alert. He exhibits normal muscle tone.  Skin: Skin is warm and dry. No erythema.  Psychiatric: He has a normal mood and affect. His behavior is normal. Judgment normal.  Diabetic foot exam normal  Blood pressure was checked twice even with patient resting in blood pressure reading 150/94 and 146/86      Assessment & Plan:  Diabetes-fairly good control but having side effects with metformin.  It is important for [place] to change it to an extended release Metformin to try to lessen the diarrhea kidney functions look good may continue with current regimen watch diet stay physically active  Hyperlipidemia increase statin to try to get better control and get LDL below 70  Patient had a recent colonoscopy had questions regarding a tubular adenoma as well as diverticula of these were explained to the patient follow-up colonoscopy in 5 years  Patient with significant anemia  issues although hemoglobin stable we will check TIBC and ferritin colonoscopy negative for tumors  Borderline blood pressure on recheck patient was encouraged to watch diet closely continue amlodipine 10 mg daily increase lisinopril to 40 mg  repeat lab work in 4 weeks in order to make sure creatinine is staying stable  Follow-up office visit 6 months

## 2017-03-17 ENCOUNTER — Telehealth: Payer: Self-pay | Admitting: Family Medicine

## 2017-03-17 NOTE — Telephone Encounter (Signed)
Patient was on pain met Fernando Vance, I called in the 24-hour met Fernando Vance this should allow for less side effects. This is what I had discussed with him. He was going to try the extended release metformin to see if he had less diarrhea with it. The pharmacy showed a gave him a 24-hour met Fernando Vance with instructions 1 twice a day-technically he could take 2 at the same time if you do rather do that once daily-if there are any problems please clarify with pharmacy that they did give him the 24-hour met Fernando Vance whereas before he was on the immediate release metformin

## 2017-03-17 NOTE — Telephone Encounter (Signed)
Patient said that Dr. Nicki Reaper changed a few of his meds yesterday.  He said he was under the impression that the Metformin was going to change, but it wasn't changed when it was called in.  He wanted to call and clarify.  [employer]

## 2017-03-18 ENCOUNTER — Other Ambulatory Visit: Payer: Self-pay | Admitting: *Deleted

## 2017-03-18 MED ORDER — METFORMIN HCL ER (MOD) 500 MG PO TB24: ORAL_TABLET | ORAL | 5 refills | Status: DC

## 2017-03-18 NOTE — Telephone Encounter (Signed)
Wendy-when getting the prior approval for this medicine what we are looking for is generic metformin extended release, I am concerned that the $2000 medicine could be a brand name only medicine. When getting this prior approval please work with insurance company and try to get generic metformin ER work with pharmacy as well-I truly doubt the patient will one to get the medication if it is costing him $2000

## 2017-03-18 NOTE — Telephone Encounter (Signed)
Left message to return call 

## 2017-03-18 NOTE — Telephone Encounter (Signed)
Called pharm. 24 hour metformin requires a PA. Med is $2,000. Called pt and let him know we will be working on PA and it will take 2 -3 days. Pt verbalized understanding. States he will continue the other metformin until he gets the new one.

## 2017-03-19 NOTE — Telephone Encounter (Signed)
Medication was sent to plan on cover my meds. Awaiting response no way to ask for specifically for generic. If medication is approved I will speak with pharmacy on the cost of generic.

## 2017-03-25 ENCOUNTER — Telehealth: Payer: Self-pay | Admitting: Family Medicine

## 2017-03-30 MED ORDER — METFORMIN HCL ER 500 MG PO TB24: ORAL_TABLET | ORAL | 1 refills | Status: DC

## 2017-03-30 NOTE — Telephone Encounter (Signed)
Metformin 500 mg XR 1 tablet twice a day was sent into pharmacy.

## 2017-03-30 NOTE — Telephone Encounter (Signed)
Received phone call from the Doctor at [facility] and was told that if we switched patient Metformin (Glumetza) 24 hr to Glucophage XR they would approve it. Please advise?

## 2017-03-30 NOTE — Telephone Encounter (Signed)
Let us go with metformin XR 1000 mg 1 daily-this can be sent in as a 90-day prescription with a refill

## 2017-03-30 NOTE — Telephone Encounter (Signed)
Thank you- let us go with 500 mg Metformin XR, take 2 daily

## 2017-03-30 NOTE — Telephone Encounter (Signed)
Metformin XR comes in 500 and 750 MG. Metformin comes in 1000 MG but it is not XR

## 2017-03-30 NOTE — Telephone Encounter (Signed)
Please see other phone message-this was handled-metformin XR

## 2017-03-31 ENCOUNTER — Telehealth: Payer: Self-pay | Admitting: *Deleted

## 2017-03-31 NOTE — Telephone Encounter (Signed)
Fax from [facility]. Metformin is denied even after appeal. See letter in dr scott's folder.

## 2017-03-31 NOTE — Telephone Encounter (Signed)
They should cover the Glucophage XR the generic of this was sent in- please notify patient that the generic extended release Metformin should be covered if there is a problem he should let us know-please see previous telephone message from representative of [facility] stating that they would cover this (what this letter is about is they will not cover brand name extended release)

## 2017-03-31 NOTE — Telephone Encounter (Signed)
Medication was changed on yesterday per Dr.Scott Luking to Metformin XR take 1 tablet twice a day. Please see message from yesterday.

## 2017-04-14 DIAGNOSIS — E785 Hyperlipidemia, unspecified: Secondary | ICD-10-CM | POA: Diagnosis not present

## 2017-04-14 DIAGNOSIS — E1169 Type 2 diabetes mellitus with other specified complication: Secondary | ICD-10-CM | POA: Diagnosis not present

## 2017-04-14 DIAGNOSIS — D649 Anemia, unspecified: Secondary | ICD-10-CM | POA: Diagnosis not present

## 2017-04-14 DIAGNOSIS — E118 Type 2 diabetes mellitus with unspecified complications: Secondary | ICD-10-CM | POA: Diagnosis not present

## 2017-04-14 DIAGNOSIS — I10 Essential (primary) hypertension: Secondary | ICD-10-CM | POA: Diagnosis not present

## 2017-04-15 LAB — BASIC METABOLIC PANEL
BUN / CREAT RATIO: 17 (ref 9–20)
BUN: 17 mg/dL (ref 6–24)
CALCIUM: 9.4 mg/dL (ref 8.7–10.2)
CHLORIDE: 102 mmol/L (ref 96–106)
CO2: 22 mmol/L (ref 20–29)
Creatinine, Ser: 0.99 mg/dL (ref 0.76–1.27)
GFR calc non Af Amer: 86 mL/min/{1.73_m2} (ref >59)
GFR, EST AFRICAN AMERICAN: 99 mL/min/{1.73_m2} (ref >59)
Glucose: 164 mg/dL — ABNORMAL HIGH (ref 65–99)
POTASSIUM: 4.4 mmol/L (ref 3.5–5.2)
Sodium: 142 mmol/L (ref 134–144)

## 2017-04-15 LAB — IRON AND TIBC
IRON: 68 ug/dL (ref 38–169)
Iron Saturation: 23 % (ref 15–55)
Total Iron Binding Capacity: 298 ug/dL (ref 250–450)
UIBC: 230 ug/dL (ref 111–343)

## 2017-04-15 LAB — FERRITIN: Ferritin: 177 ng/mL (ref 30–400)

## 2017-04-15 NOTE — Addendum Note (Signed)
Addended by: Ofilia Neas R on: 04/15/2017 02:20 PM   Modules accepted: Orders

## 2017-07-08 ENCOUNTER — Telehealth: Payer: Self-pay | Admitting: Family Medicine

## 2017-07-08 NOTE — Telephone Encounter (Signed)
Error

## 2017-07-10 DIAGNOSIS — D369 Benign neoplasm, unspecified site: Secondary | ICD-10-CM | POA: Diagnosis not present

## 2017-07-10 DIAGNOSIS — D649 Anemia, unspecified: Secondary | ICD-10-CM | POA: Diagnosis not present

## 2017-07-10 DIAGNOSIS — Z125 Encounter for screening for malignant neoplasm of prostate: Secondary | ICD-10-CM | POA: Diagnosis not present

## 2017-07-10 DIAGNOSIS — E118 Type 2 diabetes mellitus with unspecified complications: Secondary | ICD-10-CM | POA: Diagnosis not present

## 2017-07-11 LAB — LIPID PANEL
CHOLESTEROL TOTAL: 148 mg/dL (ref 100–199)
Chol/HDL Ratio: 3.9 ratio (ref 0.0–5.0)
HDL: 38 mg/dL — AB (ref >39)
LDL Calculated: 91 mg/dL (ref 0–99)
TRIGLYCERIDES: 96 mg/dL (ref 0–149)
VLDL CHOLESTEROL CAL: 19 mg/dL (ref 5–40)

## 2017-07-11 LAB — CBC WITH DIFFERENTIAL/PLATELET
BASOS ABS: 0 10*3/uL (ref 0.0–0.2)
Basos: 0 %
EOS (ABSOLUTE): 0.1 10*3/uL (ref 0.0–0.4)
Eos: 2 %
Hematocrit: 37.8 % (ref 37.5–51.0)
Hemoglobin: 11.4 g/dL — ABNORMAL LOW (ref 13.0–17.7)
Immature Grans (Abs): 0 10*3/uL (ref 0.0–0.1)
Immature Granulocytes: 0 %
Lymphocytes Absolute: 2.2 10*3/uL (ref 0.7–3.1)
Lymphs: 45 %
MCH: 22 pg — ABNORMAL LOW (ref 26.6–33.0)
MCHC: 30.2 g/dL — ABNORMAL LOW (ref 31.5–35.7)
MCV: 73 fL — ABNORMAL LOW (ref 79–97)
MONOS ABS: 0.4 10*3/uL (ref 0.1–0.9)
Monocytes: 8 %
NEUTROS PCT: 45 %
Neutrophils Absolute: 2.2 10*3/uL (ref 1.4–7.0)
PLATELETS: 353 10*3/uL (ref 150–379)
RBC: 5.19 x10E6/uL (ref 4.14–5.80)
RDW: 16.3 % — AB (ref 12.3–15.4)
WBC: 4.9 10*3/uL (ref 3.4–10.8)

## 2017-07-11 LAB — MICROALBUMIN / CREATININE URINE RATIO
Creatinine, Urine: 104.2 mg/dL
Microalb/Creat Ratio: 151.8 mg/g creat — ABNORMAL HIGH (ref 0.0–30.0)
Microalbumin, Urine: 158.2 ug/mL

## 2017-07-11 LAB — HEMOGLOBIN A1C
Est. average glucose Bld gHb Est-mCnc: 166 mg/dL
HEMOGLOBIN A1C: 7.4 % — AB (ref 4.8–5.6)

## 2017-07-11 LAB — PSA: PROSTATE SPECIFIC AG, SERUM: 0.4 ng/mL (ref 0.0–4.0)

## 2017-07-16 ENCOUNTER — Ambulatory Visit: Payer: BLUE CROSS/BLUE SHIELD | Admitting: Family Medicine

## 2017-07-16 ENCOUNTER — Encounter: Payer: Self-pay | Admitting: Family Medicine

## 2017-07-16 VITALS — BP 150/78 | Ht 68.0 in | Wt 223.0 lb

## 2017-07-16 DIAGNOSIS — E785 Hyperlipidemia, unspecified: Secondary | ICD-10-CM

## 2017-07-16 DIAGNOSIS — Z23 Encounter for immunization: Secondary | ICD-10-CM

## 2017-07-16 DIAGNOSIS — I10 Essential (primary) hypertension: Secondary | ICD-10-CM

## 2017-07-16 DIAGNOSIS — Z13220 Encounter for screening for lipoid disorders: Secondary | ICD-10-CM | POA: Diagnosis not present

## 2017-07-16 DIAGNOSIS — E1169 Type 2 diabetes mellitus with other specified complication: Secondary | ICD-10-CM

## 2017-07-16 DIAGNOSIS — E118 Type 2 diabetes mellitus with unspecified complications: Secondary | ICD-10-CM

## 2017-07-16 MED ORDER — ATORVASTATIN CALCIUM 80 MG PO TABS: 80.0000 mg | ORAL_TABLET | Freq: Every day | ORAL | 5 refills | Status: DC

## 2017-07-16 MED ORDER — GLIPIZIDE ER 5 MG PO TB24: 5.0000 mg | ORAL_TABLET | Freq: Every day | ORAL | 5 refills | Status: DC

## 2017-07-16 MED ORDER — EMPAGLIFLOZIN 10 MG PO TABS: 10.0000 mg | ORAL_TABLET | Freq: Every day | ORAL | 5 refills | Status: DC

## 2017-07-16 NOTE — Progress Notes (Addendum)
Subjective:    Patient ID: Fernando Vance, male    DOB: [birth year], 57 y.o.   MRN: [id number]  Diabetes  He presents for his follow-up diabetic visit. He has type 2 diabetes mellitus. Pertinent negatives for hypoglycemia include no confusion or headaches. Pertinent negatives for diabetes include no chest pain, no fatigue, no polydipsia, no polyphagia and no weakness. He is compliant with treatment all of the time. He participates in exercise intermittently. Home blood sugar record trend: 120's. He does not see a podiatrist.Eye exam is not current.  A1C 7.4 done on bloodwork 6 days ago.   Pt states no concerns today.   Patient for blood pressure check up. Patient relates compliance with meds. Todays BP reviewed with the patient. Patient denies issues with medication. Patient relates reasonable diet. Patient tries to minimize salt. Patient aware of BP goals.  Patient here for follow-up regarding cholesterol.  Patient does try to maintain a reasonable diet.  Patient does take the medication on a regular basis.  Denies missing a dose.  The patient denies any obvious side effects.  Prior blood work results reviewed with the patient.  The patient is aware of his cholesterol goals and the need to keep it under good control to lessen the risk of disease.  The patient was seen today as part of a comprehensive diabetic check up.The patient relates medication compliance. No significant side effects to the medications. Denies any low glucose spells. Relates compliance with diet to a reasonable level. Patient does do labwork intermittently and understands the dangers of diabetes.    Review of Systems  Constitutional: Negative for activity change, appetite change and fatigue.  HENT: Negative for congestion and rhinorrhea.   Respiratory: Negative for cough, chest tightness and shortness of breath.   Cardiovascular: Negative for chest pain and leg swelling.  Gastrointestinal: Negative for abdominal  pain, diarrhea and nausea.  Endocrine: Negative for polydipsia and polyphagia.  Genitourinary: Negative for dysuria and hematuria.  Neurological: Negative for weakness and headaches.  Psychiatric/Behavioral: Negative for confusion and dysphoric mood.       Objective:   Physical Exam  Constitutional: He appears well-nourished. No distress.  HENT:  Head: Normocephalic and atraumatic.  Eyes: Right eye exhibits no discharge. Left eye exhibits no discharge.  Neck: No tracheal deviation present.  Cardiovascular: Normal rate, regular rhythm and normal heart sounds.  No murmur heard. Pulmonary/Chest: Effort normal and breath sounds normal. No respiratory distress. He has no wheezes.  Musculoskeletal: He exhibits no edema.  Lymphadenopathy:    He has no cervical adenopathy.  Neurological: He is alert.  Skin: Skin is warm. No rash noted.  Psychiatric: His behavior is normal.  Vitals reviewed.         Assessment & Plan:  Prostate exam on next visit  The patient was seen today as part of a comprehensive visit for diabetes. The importance of keeping her A1c at or below 7 was discussed. Importance of regular physical activity was discussed. Proper monitoring of glucose levels with glucometer discussed. The importance of adherence to medication as well as a controlled low starch/sugar diet was also discussed. Also discussion regarding the importance of diabetic foot checks including self check every day. Also yearly diabetic eye exams recommended. The importance of keeping blood pressure under control and keeping LDL below 70 was also discussed. Also the importance of avoiding smoking. Standard follow-up visit recommended. Finally failure to follow good diabetic measures including self effort and compliance with recommendations can certainly increase  the risk of heart disease strokes kidney failure blindness loss of limb and early death was discussed with the patient. Subpar control add  additional medicine Jardiance side effects were discussed in detail if any low sugar urgency notify us-the patient is willing to add additional medication.  It is felt that this will reduce his risk of heart attacks and strokes.  The patient was seen today as part of an evaluation regarding hyperlipidemia. Recent lab work has been reviewed with the patient as well as the goals for good cholesterol care. In addition to this medications have been discussed the importance of compliance with diet and medications discussed as well. Patient has been informed of potential side effects of medications in the importance to notify us should any problems occur. Finally the patient is aware that poor control of cholesterol, noncompliance can dramatically increase her risk of heart attack strokes and premature death. The patient will keep regular office visits and the patient does agreed to periodic lab work. Adjustment of medication the goal is to keep LDL below 70 follow-up lab work again in 4 months  HTN- Patient was seen today as part of a visit regarding hypertension. The importance of healthy diet and regular physical activity was discussed. The importance of compliance with medications discussed. Ideal goal is to keep blood pressure low elevated levels certainly below 808/81 when possible. The patient was counseled that keeping blood pressure under control lessen his risk of heart attack, stroke, kidney failure, and early death. The importance of regular follow-ups was discussed with the patient. Low-salt diet such as DASH recommended. Regular physical activity was recommended as well. Patient was advised to keep regular follow-ups. Patient has an element of whitecoat hypertension he will check blood pressures outside the office he will notify us of the results of this

## 2017-07-16 NOTE — Patient Instructions (Addendum)
Do your labs in 4 months then follow up  Send [place] glucose readings and blod pressure readings in 3 to 4 weeks      DASH Eating Plan DASH stands for "Dietary Approaches to Stop Hypertension." The DASH eating plan is a healthy eating plan that has been shown to reduce high blood pressure (hypertension). It may also reduce your risk for type 2 diabetes, heart disease, and stroke. The DASH eating plan may also help with weight loss. What are tips for following this plan? General guidelines  Avoid eating more than 2,300 mg (milligrams) of salt (sodium) a day. If you have hypertension, you may need to reduce your sodium intake to 1,500 mg a day.  Limit alcohol intake to no more than 1 drink a day for nonpregnant women and 2 drinks a day for men. One drink equals 12 oz of beer, 5 oz of wine, or 1 oz of hard liquor.  Work with your health care provider to maintain a healthy body weight or to lose weight. Ask what an ideal weight is for you.  Get at least 30 minutes of exercise that causes your heart to beat faster (aerobic exercise) most days of the week. Activities may include walking, swimming, or biking.  Work with your health care provider or diet and nutrition specialist (dietitian) to adjust your eating plan to your individual calorie needs. Reading food labels  Check food labels for the amount of sodium per serving. Choose foods with less than 5 percent of the Daily Value of sodium. Generally, foods with less than 300 mg of sodium per serving fit into this eating plan.  To find whole grains, look for the word "whole" as the first word in the ingredient list. Shopping  Buy products labeled as "low-sodium" or "no salt added."  Buy fresh foods. Avoid canned foods and premade or frozen meals. Cooking  Avoid adding salt when cooking. Use salt-free seasonings or herbs instead of table salt or sea salt. Check with your health care provider or pharmacist before using salt substitutes.  Do  not fry foods. Cook foods using healthy methods such as baking, boiling, grilling, and broiling instead.  Cook with heart-healthy oils, such as olive, canola, soybean, or sunflower oil. Meal planning   Eat a balanced diet that includes: ? 5 or more servings of fruits and vegetables each day. At each meal, try to fill half of your plate with fruits and vegetables. ? Up to 6-8 servings of whole grains each day. ? Less than 6 oz of lean meat, poultry, or fish each day. A 3-oz serving of meat is about the same size as a deck of cards. One egg equals 1 oz. ? 2 servings of low-fat dairy each day. ? A serving of nuts, seeds, or beans 5 times each week. ? Heart-healthy fats. Healthy fats called Omega-3 fatty acids are found in foods such as flaxseeds and coldwater fish, like sardines, salmon, and mackerel.  Limit how much you eat of the following: ? Canned or prepackaged foods. ? Food that is high in trans fat, such as fried foods. ? Food that is high in saturated fat, such as fatty meat. ? Sweets, desserts, sugary drinks, and other foods with added sugar. ? Full-fat dairy products.  Do not salt foods before eating.  Try to eat at least 2 vegetarian meals each week.  Eat more home-cooked food and less restaurant, buffet, and fast food.  When eating at a restaurant, ask that your food be  prepared with less salt or no salt, if possible. What foods are recommended? The items listed may not be a complete list. Talk with your dietitian about what dietary choices are best for you. Grains Whole-grain or whole-wheat bread. Whole-grain or whole-wheat pasta. Brown rice. [place]. Bulgur. Whole-grain and low-sodium cereals. Pita bread. Low-fat, low-sodium crackers. Whole-wheat flour tortillas. Vegetables Fresh or frozen vegetables (raw, steamed, roasted, or grilled). Low-sodium or reduced-sodium tomato and vegetable juice. Low-sodium or reduced-sodium tomato sauce and tomato paste. Low-sodium or  reduced-sodium canned vegetables. Fruits All fresh, dried, or frozen fruit. Canned fruit in natural juice (without added sugar). Meat and other protein foods Skinless chicken or [place]. Ground chicken or [place]. Pork with fat trimmed off. Fish and seafood. Egg whites. Dried beans, peas, or lentils. Unsalted nuts, nut butters, and seeds. Unsalted canned beans. Lean cuts of beef with fat trimmed off. Low-sodium, lean deli meat. Dairy Low-fat (1%) or fat-free (skim) milk. Fat-free, low-fat, or reduced-fat cheeses. Nonfat, low-sodium ricotta or cottage cheese. Low-fat or nonfat yogurt. Low-fat, low-sodium cheese. Fats and oils Soft margarine without trans fats. Vegetable oil. Low-fat, reduced-fat, or light mayonnaise and salad dressings (reduced-sodium). Canola, safflower, olive, soybean, and sunflower oils. Avocado. Seasoning and other foods Herbs. Spices. Seasoning mixes without salt. Unsalted popcorn and pretzels. Fat-free sweets. What foods are not recommended? The items listed may not be a complete list. Talk with your dietitian about what dietary choices are best for you. Grains Baked goods made with fat, such as croissants, muffins, or some breads. Dry pasta or rice meal packs. Vegetables Creamed or fried vegetables. Vegetables in a cheese sauce. Regular canned vegetables (not low-sodium or reduced-sodium). Regular canned tomato sauce and paste (not low-sodium or reduced-sodium). Regular tomato and vegetable juice (not low-sodium or reduced-sodium). Angie Fava. Olives. Fruits Canned fruit in a light or heavy syrup. Fried fruit. Fruit in cream or butter sauce. Meat and other protein foods Fatty cuts of meat. Ribs. Fried meat. Berniece Salines. Sausage. Bologna and other processed lunch meats. Salami. Fatback. Hotdogs. Bratwurst. Salted nuts and seeds. Canned beans with added salt. Canned or smoked fish. Whole eggs or egg yolks. Chicken or [place] with skin. Dairy Whole or 2% milk, cream, and half-and-half.  Whole or full-fat cream cheese. Whole-fat or sweetened yogurt. Full-fat cheese. Nondairy creamers. Whipped toppings. Processed cheese and cheese spreads. Fats and oils Butter. Stick margarine. Lard. Shortening. Ghee. Bacon fat. Tropical oils, such as coconut, palm kernel, or palm oil. Seasoning and other foods Salted popcorn and pretzels. Onion salt, garlic salt, seasoned salt, table salt, and sea salt. Worcestershire sauce. Tartar sauce. Barbecue sauce. Teriyaki sauce. Soy sauce, including reduced-sodium. Steak sauce. Canned and packaged gravies. Fish sauce. Oyster sauce. Cocktail sauce. Horseradish that you find on the shelf. Ketchup. Mustard. Meat flavorings and tenderizers. Bouillon cubes. Hot sauce and Tabasco sauce. Premade or packaged marinades. Premade or packaged taco seasonings. Relishes. Regular salad dressings. Where to find more information:  National Heart, Lung, and [place]: https://wilson-eaton.com/  American Heart Association: www.heart.org Summary  The DASH eating plan is a healthy eating plan that has been shown to reduce high blood pressure (hypertension). It may also reduce your risk for type 2 diabetes, heart disease, and stroke.  With the DASH eating plan, you should limit salt (sodium) intake to 2,300 mg a day. If you have hypertension, you may need to reduce your sodium intake to 1,500 mg a day.  When on the DASH eating plan, aim to eat more fresh fruits and vegetables, whole grains,  lean proteins, low-fat dairy, and heart-healthy fats.  Work with your health care provider or diet and nutrition specialist (dietitian) to adjust your eating plan to your individual calorie needs. This information is not intended to replace advice given to you by your health care provider. Make sure you discuss any questions you have with your health care provider. Document Released: 04/24/2011 Document Revised: 04/28/2016 Document Reviewed: 04/28/2016 Elsevier Interactive Patient Education   [employer].

## 2017-09-24 ENCOUNTER — Other Ambulatory Visit: Payer: Self-pay | Admitting: Family Medicine

## 2017-10-26 ENCOUNTER — Other Ambulatory Visit: Payer: Self-pay | Admitting: Family Medicine

## 2017-11-27 DIAGNOSIS — E118 Type 2 diabetes mellitus with unspecified complications: Secondary | ICD-10-CM | POA: Diagnosis not present

## 2017-11-27 DIAGNOSIS — Z13220 Encounter for screening for lipoid disorders: Secondary | ICD-10-CM | POA: Diagnosis not present

## 2017-11-27 DIAGNOSIS — I10 Essential (primary) hypertension: Secondary | ICD-10-CM | POA: Diagnosis not present

## 2017-11-28 LAB — LIPID PANEL
Chol/HDL Ratio: 3.6 ratio (ref 0.0–5.0)
Cholesterol, Total: 126 mg/dL (ref 100–199)
HDL: 35 mg/dL — AB (ref >39)
LDL Calculated: 74 mg/dL (ref 0–99)
TRIGLYCERIDES: 86 mg/dL (ref 0–149)
VLDL CHOLESTEROL CAL: 17 mg/dL (ref 5–40)

## 2017-11-28 LAB — BASIC METABOLIC PANEL
BUN/Creatinine Ratio: 16 (ref 9–20)
BUN: 23 mg/dL (ref 6–24)
CALCIUM: 9.7 mg/dL (ref 8.7–10.2)
CO2: 18 mmol/L — ABNORMAL LOW (ref 20–29)
Chloride: 106 mmol/L (ref 96–106)
Creatinine, Ser: 1.42 mg/dL — ABNORMAL HIGH (ref 0.76–1.27)
GFR, EST AFRICAN AMERICAN: 64 mL/min/{1.73_m2} (ref >59)
GFR, EST NON AFRICAN AMERICAN: 55 mL/min/{1.73_m2} — AB (ref >59)
Glucose: 127 mg/dL — ABNORMAL HIGH (ref 65–99)
Potassium: 5.3 mmol/L — ABNORMAL HIGH (ref 3.5–5.2)
Sodium: 140 mmol/L (ref 134–144)

## 2017-11-28 LAB — HEMOGLOBIN A1C
Est. average glucose Bld gHb Est-mCnc: 146 mg/dL
HEMOGLOBIN A1C: 6.7 % — AB (ref 4.8–5.6)

## 2017-12-02 ENCOUNTER — Encounter: Payer: Self-pay | Admitting: Family Medicine

## 2017-12-02 ENCOUNTER — Ambulatory Visit: Payer: BLUE CROSS/BLUE SHIELD | Admitting: Family Medicine

## 2017-12-02 VITALS — BP 142/80 | Ht 68.0 in | Wt 208.0 lb

## 2017-12-02 DIAGNOSIS — I10 Essential (primary) hypertension: Secondary | ICD-10-CM

## 2017-12-02 DIAGNOSIS — M542 Cervicalgia: Secondary | ICD-10-CM | POA: Diagnosis not present

## 2017-12-02 DIAGNOSIS — N289 Disorder of kidney and ureter, unspecified: Secondary | ICD-10-CM

## 2017-12-02 DIAGNOSIS — E119 Type 2 diabetes mellitus without complications: Secondary | ICD-10-CM | POA: Diagnosis not present

## 2017-12-02 DIAGNOSIS — E1169 Type 2 diabetes mellitus with other specified complication: Secondary | ICD-10-CM | POA: Diagnosis not present

## 2017-12-02 DIAGNOSIS — E785 Hyperlipidemia, unspecified: Secondary | ICD-10-CM

## 2017-12-02 MED ORDER — AMLODIPINE BESYLATE 10 MG PO TABS: 10.0000 mg | ORAL_TABLET | Freq: Every day | ORAL | 1 refills | Status: DC

## 2017-12-02 MED ORDER — ATORVASTATIN CALCIUM 80 MG PO TABS
80.0000 mg | ORAL_TABLET | Freq: Every day | ORAL | 1 refills | Status: DC
Start: 2017-12-02 — End: 2018-05-26

## 2017-12-02 MED ORDER — EMPAGLIFLOZIN 10 MG PO TABS: 10.0000 mg | ORAL_TABLET | Freq: Every day | ORAL | 1 refills | Status: DC

## 2017-12-02 MED ORDER — GLIPIZIDE ER 5 MG PO TB24: 5.0000 mg | ORAL_TABLET | Freq: Every day | ORAL | 1 refills | Status: DC

## 2017-12-02 MED ORDER — METFORMIN HCL ER 500 MG PO TB24: ORAL_TABLET | ORAL | 1 refills | Status: DC

## 2017-12-02 MED ORDER — LISINOPRIL 40 MG PO TABS: 40.0000 mg | ORAL_TABLET | Freq: Every day | ORAL | 3 refills | Status: DC

## 2017-12-02 NOTE — Progress Notes (Signed)
Subjective:    Patient ID: Fernando Vance, male    DOB: [birth year], 57 y.o.   MRN: [id number]  Diabetes  He presents for his follow-up diabetic visit. He has type 2 diabetes mellitus. Pertinent negatives for hypoglycemia include no confusion or headaches. Pertinent negatives for diabetes include no chest pain, no fatigue, no polydipsia, no polyphagia and no weakness. He is compliant with treatment all of the time. Exercise: walks as much as he can. He does not see a podiatrist.Eye exam is not current.   A1C 6.7 on bloodwork 5 days ago.   Pt states no concerns today.  Results for orders placed or performed in visit on 07/16/17  Lipid panel  Result Value Ref Range   Cholesterol, Total 126 100 - 199 mg/dL   Triglycerides 86 0 - 149 mg/dL   HDL 35 (L) >39 mg/dL   VLDL Cholesterol Cal 17 5 - 40 mg/dL   LDL Calculated 74 0 - 99 mg/dL   Chol/HDL Ratio 3.6 0.0 - 5.0 ratio  Hemoglobin A1c  Result Value Ref Range   Hgb A1c MFr Bld 6.7 (H) 4.8 - 5.6 %   Est. average glucose Bld gHb Est-mCnc 146 mg/dL  Basic metabolic panel  Result Value Ref Range   Glucose 127 (H) 65 - 99 mg/dL   BUN 23 6 - 24 mg/dL   Creatinine, Ser 1.42 (H) 0.76 - 1.27 mg/dL   GFR calc non Af Amer 55 (L) >59 mL/min/1.73   GFR calc Af Amer 64 >59 mL/min/1.73   BUN/Creatinine Ratio 16 9 - 20   Sodium 140 134 - 144 mmol/L   Potassium 5.3 (H) 3.5 - 5.2 mmol/L   Chloride 106 96 - 106 mmol/L   CO2 18 (L) 20 - 29 mmol/L   Calcium 9.7 8.7 - 10.2 mg/dL   Patient here for follow-up regarding cholesterol.  The patient does have hyperlipidemia.  Patient does try to maintain a reasonable diet.  Patient does take the medication on a regular basis.  Denies missing a dose.  The patient denies any obvious side effects.  Prior blood work results reviewed with the patient.  The patient is aware of his cholesterol goals and the need to keep it under good control to lessen the risk of disease.  The patient was seen today as part of a  comprehensive diabetic check up.the patient does have diabetes.  The patient follows here on a regular basis.  The patient relates medication compliance. No significant side effects to the medications. Denies any low glucose spells. Relates compliance with diet to a reasonable level. Patient does do labwork intermittently and understands the dangers of diabetes.   Patient for blood pressure check up.  The patient does have hypertension.  The patient is on medication.  Patient relates compliance with meds. Todays BP reviewed with the patient. Patient denies issues with medication. Patient relates reasonable diet. Patient tries to minimize salt. Patient aware of BP goals.  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  HENT: Negative for congestion and rhinorrhea.   Respiratory: Negative for cough, chest tightness and  shortness of breath.   Cardiovascular: Negative for chest pain and leg swelling.  Gastrointestinal: Negative for abdominal pain, diarrhea and nausea.  Endocrine: Negative for polydipsia and polyphagia.  Genitourinary: Negative for dysuria and hematuria.  Neurological: Negative for weakness and headaches.  Psychiatric/Behavioral: Negative for confusion and dysphoric mood.       Objective:   Physical Exam  Constitutional: He appears well-nourished. No distress.  Cardiovascular: Normal rate, regular rhythm and normal heart sounds.  No murmur heard. Pulmonary/Chest: Effort normal and breath sounds normal. No respiratory distress.  Musculoskeletal: He exhibits no edema.  Lymphadenopathy:    He has no cervical  adenopathy.  Neurological: He is alert.  Psychiatric: His behavior is normal.  Vitals reviewed.  Good range of motion of the shoulder and the neck no tenderness no pain No sign of any weakness in the arms  Diabetic foot exam normal    Assessment & Plan:  HTN- Patient was seen today as part of a visit regarding hypertension. The importance of healthy diet and regular physical activity was discussed. The importance of compliance with medications discussed.  Ideal goal is to keep blood pressure low elevated levels certainly below 295/62 when possible.  The patient was counseled that keeping blood pressure under control lessen his risk of complications.  The importance of regular follow-ups was discussed with the patient.  Low-salt diet such as DASH recommended.  Regular physical activity was recommended as well.  Patient was advised to keep regular follow-ups.  The patient was seen today as part of an evaluation regarding hyperlipidemia.  Recent lab work has been reviewed with the patient as well as the goals for good cholesterol care.  In addition to this medications have been discussed the importance of compliance with diet and medications discussed as well.  Finally the patient is aware that poor control of cholesterol, noncompliance can dramatically increase the risk of complications. The patient will keep regular office visits and the patient does agreed to periodic lab work.  The patient was seen today as part of a comprehensive visit for diabetes. The importance of keeping her A1c at or below 7 was discussed.  Importance of regular physical activity was discussed.   The importance of adherence to medication as well as a controlled low starch/sugar diet was also discussed.  Standard follow-up visit recommended.  Also patient aware failure to keep diabetes under control increases the risk of complications.  Cervical spine impingement possible if it gets progressively worse or other  problems he will be referral for physical therapy currently right now stretches were shown to him for him to do warning signs discussed  25 minutes was spent with the patient.  This statement verifies that 25 minutes was indeed spent with the patient.  More than 50% of this visit-total duration of the visit-was spent in counseling and coordination of care. The issues that the patient came in for today as reflected in the diagnosis (s) please refer to documentation for further details.  Renal insufficiency patient will repeat metabolic 7 once well-hydrated

## 2017-12-02 NOTE — Patient Instructions (Signed)
Results for orders placed or performed in visit on 07/16/17  Lipid panel  Result Value Ref Range   Cholesterol, Total 126 100 - 199 mg/dL   Triglycerides 86 0 - 149 mg/dL   HDL 35 (L) >39 mg/dL   VLDL Cholesterol Cal 17 5 - 40 mg/dL   LDL Calculated 74 0 - 99 mg/dL   Chol/HDL Ratio 3.6 0.0 - 5.0 ratio  Hemoglobin A1c  Result Value Ref Range   Hgb A1c MFr Bld 6.7 (H) 4.8 - 5.6 %   Est. average glucose Bld gHb Est-mCnc 146 mg/dL  Basic metabolic panel  Result Value Ref Range   Glucose 127 (H) 65 - 99 mg/dL   BUN 23 6 - 24 mg/dL   Creatinine, Ser 1.42 (H) 0.76 - 1.27 mg/dL   GFR calc non Af Amer 55 (L) >59 mL/min/1.73   GFR calc Af Amer 64 >59 mL/min/1.73   BUN/Creatinine Ratio 16 9 - 20   Sodium 140 134 - 144 mmol/L   Potassium 5.3 (H) 3.5 - 5.2 mmol/L   Chloride 106 96 - 106 mmol/L   CO2 18 (L) 20 - 29 mmol/L   Calcium 9.7 8.7 - 10.2 mg/dL

## 2018-05-18 ENCOUNTER — Other Ambulatory Visit: Payer: Self-pay | Admitting: Family Medicine

## 2018-05-26 ENCOUNTER — Other Ambulatory Visit: Payer: Self-pay | Admitting: Family Medicine

## 2018-06-08 ENCOUNTER — Other Ambulatory Visit: Payer: Self-pay | Admitting: Family Medicine

## 2018-06-08 NOTE — Telephone Encounter (Signed)
1 month refill needs office visit

## 2018-07-09 ENCOUNTER — Other Ambulatory Visit: Payer: Self-pay | Admitting: Family Medicine

## 2018-08-05 ENCOUNTER — Other Ambulatory Visit: Payer: Self-pay | Admitting: Family Medicine

## 2018-08-05 NOTE — Telephone Encounter (Signed)
May have 3 months on refills needs follow-up office visit by June

## 2018-09-07 ENCOUNTER — Other Ambulatory Visit: Payer: Self-pay | Admitting: Family Medicine

## 2018-09-07 NOTE — Telephone Encounter (Signed)
Virtual visit 1 refill

## 2018-10-07 ENCOUNTER — Other Ambulatory Visit: Payer: Self-pay | Admitting: Family Medicine

## 2018-10-07 NOTE — Telephone Encounter (Signed)
Schedule virtual visit within the next 30 days may have 1 refill

## 2018-10-08 NOTE — Telephone Encounter (Signed)
Left message to return call 

## 2018-10-20 NOTE — Telephone Encounter (Signed)
Left message to return call 

## 2018-11-03 ENCOUNTER — Ambulatory Visit (INDEPENDENT_AMBULATORY_CARE_PROVIDER_SITE_OTHER): Payer: BC Managed Care – PPO | Admitting: Family Medicine

## 2018-11-03 ENCOUNTER — Other Ambulatory Visit: Payer: Self-pay

## 2018-11-03 ENCOUNTER — Encounter: Payer: Self-pay | Admitting: Family Medicine

## 2018-11-03 VITALS — Wt 217.0 lb

## 2018-11-03 DIAGNOSIS — E118 Type 2 diabetes mellitus with unspecified complications: Secondary | ICD-10-CM

## 2018-11-03 DIAGNOSIS — Z125 Encounter for screening for malignant neoplasm of prostate: Secondary | ICD-10-CM

## 2018-11-03 DIAGNOSIS — E1169 Type 2 diabetes mellitus with other specified complication: Secondary | ICD-10-CM | POA: Diagnosis not present

## 2018-11-03 DIAGNOSIS — I10 Essential (primary) hypertension: Secondary | ICD-10-CM | POA: Diagnosis not present

## 2018-11-03 DIAGNOSIS — E785 Hyperlipidemia, unspecified: Secondary | ICD-10-CM

## 2018-11-03 MED ORDER — AMLODIPINE BESYLATE 10 MG PO TABS: 10.0000 mg | ORAL_TABLET | Freq: Every day | ORAL | 1 refills | Status: DC

## 2018-11-03 MED ORDER — JARDIANCE 10 MG PO TABS: 10.0000 mg | ORAL_TABLET | Freq: Every day | ORAL | 1 refills | Status: DC

## 2018-11-03 MED ORDER — ATORVASTATIN CALCIUM 80 MG PO TABS: 80.0000 mg | ORAL_TABLET | Freq: Every day | ORAL | 1 refills | Status: DC

## 2018-11-03 MED ORDER — GLIPIZIDE ER 5 MG PO TB24: ORAL_TABLET | ORAL | 1 refills | Status: DC

## 2018-11-03 MED ORDER — LISINOPRIL 40 MG PO TABS: 40.0000 mg | ORAL_TABLET | Freq: Every day | ORAL | 3 refills | Status: DC

## 2018-11-03 MED ORDER — METFORMIN HCL ER 500 MG PO TB24: 500.0000 mg | ORAL_TABLET | Freq: Two times a day (BID) | ORAL | 1 refills | Status: DC

## 2018-11-03 NOTE — Progress Notes (Signed)
Subjective:    Patient ID: Fernando Vance, male    DOB: 1961/04/19, 58 y.o.   MRN: [id number]  Hypertension This is a chronic problem. Pertinent negatives include no chest pain, headaches or shortness of breath. Risk factors for coronary artery disease include diabetes mellitus. There are no compliance problems.   Patient for blood pressure check up.  The patient does have hypertension.  The patient is on medication.  Patient relates compliance with meds. Todays BP reviewed with the patient. Patient denies issues with medication. Patient relates reasonable diet. Patient tries to minimize salt. Patient aware of BP goals.  Patient here for follow-up regarding cholesterol.  The patient does have hyperlipidemia.  Patient does try to maintain a reasonable diet.  Patient does take the medication on a regular basis.  Denies missing a dose.  The patient denies any obvious side effects.  Prior blood work results reviewed with the patient.  The patient is aware of his cholesterol goals and the need to keep it under good control to lessen the risk of disease.  The patient was seen today as part of a comprehensive diabetic check up.the patient does have diabetes.  The patient follows here on a regular basis.  The patient relates medication compliance. No significant side effects to the medications. Denies any low glucose spells. Relates compliance with diet to a reasonable level. Patient does do labwork intermittently and understands the dangers of diabetes.    Virtual Visit via Video Note  I connected with Fernando Vance on 11/03/18 at 11:00 AM EDT by a video enabled telemedicine application and verified that I am speaking with the correct person using two identifiers.  Location: Patient: home Provider: office   I discussed the limitations of evaluation and management by telemedicine and the availability of in person appointments. The patient expressed understanding and agreed to proceed.   History of Present Illness:    Observations/Objective:   Assessment and Plan:   Follow Up Instructions:    I discussed the assessment and treatment plan with the patient. The patient was provided an opportunity to ask questions and all were answered. The patient agreed with the plan and demonstrated an understanding of the instructions.   The patient was advised to call back or seek an in-person evaluation if the symptoms worsen or if the condition fails to improve as anticipated.  I provided 17 minutes of non-face-to-face time during this encounter.   Vicente Males, LPN   15 minutes was spent with patient today discussing healthcare issues which they came.  More than 50% of this visit-total duration of visit-was spent in counseling and coordination of care.  Please see diagnosis regarding the focus of this coordination and care  Review of Systems  Constitutional: Negative for diaphoresis and fatigue.  HENT: Negative for congestion and rhinorrhea.   Respiratory: Negative for cough and shortness of breath.   Cardiovascular: Negative for chest pain and leg swelling.  Gastrointestinal: Negative for abdominal pain and diarrhea.  Skin: Negative for color change and rash.  Neurological: Negative for dizziness and headaches.  Psychiatric/Behavioral: Negative for behavioral problems and confusion.       Objective:   Physical Exam   Today's visit was via telephone Physical exam was not possible for this visit      Assessment & Plan:  HTN- Patient was seen today as part of a visit regarding hypertension. The importance of healthy diet and regular physical activity was discussed. The importance of compliance with medications  discussed.  Ideal goal is to keep blood pressure low elevated levels certainly below 960/45 when possible.  The patient was counseled that keeping blood pressure under control lessen his risk of complications.  The importance of regular follow-ups was  discussed with the patient.  Low-salt diet such as DASH recommended.  Regular physical activity was recommended as well.  Patient was advised to keep regular follow-ups.  The patient was seen today as part of an evaluation regarding hyperlipidemia.  Recent lab work has been reviewed with the patient as well as the goals for good cholesterol care.  In addition to this medications have been discussed the importance of compliance with diet and medications discussed as well.  Finally the patient is aware that poor control of cholesterol, noncompliance can dramatically increase the risk of complications. The patient will keep regular office visits and the patient does agreed to periodic lab work.  The patient was seen today as part of a comprehensive visit for diabetes. The importance of keeping her A1c at or below 7 was discussed.  Importance of regular physical activity was discussed.   The importance of adherence to medication as well as a controlled low starch/sugar diet was also discussed.  Standard follow-up visit recommended.  Also patient aware failure to keep diabetes under control increases the risk of complications.  We also discussed coronavirus protection and prevention refills of medications was given lab work ordered await the results follow-up later this fall in person

## 2018-12-07 DIAGNOSIS — E1169 Type 2 diabetes mellitus with other specified complication: Secondary | ICD-10-CM | POA: Diagnosis not present

## 2018-12-07 DIAGNOSIS — E118 Type 2 diabetes mellitus with unspecified complications: Secondary | ICD-10-CM | POA: Diagnosis not present

## 2018-12-07 DIAGNOSIS — I10 Essential (primary) hypertension: Secondary | ICD-10-CM | POA: Diagnosis not present

## 2018-12-07 DIAGNOSIS — E785 Hyperlipidemia, unspecified: Secondary | ICD-10-CM | POA: Diagnosis not present

## 2018-12-08 LAB — PSA: Prostate Specific Ag, Serum: 0.4 ng/mL (ref 0.0–4.0)

## 2018-12-08 LAB — BASIC METABOLIC PANEL
BUN/Creatinine Ratio: 15 (ref 9–20)
BUN: 17 mg/dL (ref 6–24)
CO2: 17 mmol/L — ABNORMAL LOW (ref 20–29)
Calcium: 9.5 mg/dL (ref 8.7–10.2)
Chloride: 105 mmol/L (ref 96–106)
Creatinine, Ser: 1.15 mg/dL (ref 0.76–1.27)
GFR calc Af Amer: 82 mL/min/{1.73_m2} (ref >59)
GFR calc non Af Amer: 71 mL/min/{1.73_m2} (ref >59)
Glucose: 108 mg/dL — ABNORMAL HIGH (ref 65–99)
Potassium: 4.2 mmol/L (ref 3.5–5.2)
Sodium: 142 mmol/L (ref 134–144)

## 2018-12-08 LAB — HEPATIC FUNCTION PANEL
ALT: 18 IU/L (ref 0–44)
AST: 15 IU/L (ref 0–40)
Albumin: 4.9 g/dL (ref 3.8–4.9)
Alkaline Phosphatase: 102 IU/L (ref 39–117)
Bilirubin Total: 0.6 mg/dL (ref 0.0–1.2)
Bilirubin, Direct: 0.17 mg/dL (ref 0.00–0.40)
Total Protein: 7.7 g/dL (ref 6.0–8.5)

## 2018-12-08 LAB — LIPID PANEL
Chol/HDL Ratio: 4.2 ratio (ref 0.0–5.0)
Cholesterol, Total: 166 mg/dL (ref 100–199)
HDL: 40 mg/dL (ref >39)
LDL Calculated: 94 mg/dL (ref 0–99)
Triglycerides: 160 mg/dL — ABNORMAL HIGH (ref 0–149)
VLDL Cholesterol Cal: 32 mg/dL (ref 5–40)

## 2018-12-08 LAB — MICROALBUMIN / CREATININE URINE RATIO
Creatinine, Urine: 71.3 mg/dL
Microalb/Creat Ratio: 60 mg/g creat — ABNORMAL HIGH (ref 0–29)
Microalbumin, Urine: 43.1 ug/mL

## 2018-12-08 LAB — HEMOGLOBIN A1C
Est. average glucose Bld gHb Est-mCnc: 163 mg/dL
Hgb A1c MFr Bld: 7.3 % — ABNORMAL HIGH (ref 4.8–5.6)

## 2018-12-09 ENCOUNTER — Encounter: Payer: Self-pay | Admitting: Family Medicine

## 2018-12-10 ENCOUNTER — Telehealth: Payer: Self-pay | Admitting: *Deleted

## 2018-12-10 NOTE — Telephone Encounter (Signed)
Left message to return call. See form in nurse box from [facility]. Some lots of long acting metformin were recalled. He should check with his pharm to see if he was recalled and if so to call office and let dr scott know to get new rx for short acting.

## 2018-12-14 NOTE — Telephone Encounter (Signed)
Left message to return call 

## 2018-12-16 NOTE — Telephone Encounter (Signed)
Discussed with pt. Pt verbalized understanding and he states he already checked.

## 2019-01-05 DIAGNOSIS — R6889 Other general symptoms and signs: Secondary | ICD-10-CM | POA: Diagnosis not present

## 2019-02-12 ENCOUNTER — Other Ambulatory Visit: Payer: BC Managed Care – PPO

## 2019-02-19 ENCOUNTER — Other Ambulatory Visit (INDEPENDENT_AMBULATORY_CARE_PROVIDER_SITE_OTHER): Payer: BC Managed Care – PPO | Admitting: *Deleted

## 2019-02-19 DIAGNOSIS — Z23 Encounter for immunization: Secondary | ICD-10-CM

## 2019-06-15 ENCOUNTER — Other Ambulatory Visit: Payer: Self-pay | Admitting: Family Medicine

## 2019-06-15 NOTE — Telephone Encounter (Signed)
1 refill needs to do a virtual visit please

## 2019-06-15 NOTE — Telephone Encounter (Signed)
Last med check up 11/03/18

## 2019-06-15 NOTE — Telephone Encounter (Signed)
Please schedule and then route back to nurses to send in refills 

## 2019-06-16 NOTE — Telephone Encounter (Signed)
Scheduled 2/16

## 2019-07-05 ENCOUNTER — Ambulatory Visit (INDEPENDENT_AMBULATORY_CARE_PROVIDER_SITE_OTHER): Payer: BC Managed Care – PPO | Admitting: Family Medicine

## 2019-07-05 ENCOUNTER — Other Ambulatory Visit: Payer: Self-pay

## 2019-07-05 DIAGNOSIS — E1169 Type 2 diabetes mellitus with other specified complication: Secondary | ICD-10-CM | POA: Diagnosis not present

## 2019-07-05 DIAGNOSIS — D649 Anemia, unspecified: Secondary | ICD-10-CM | POA: Diagnosis not present

## 2019-07-05 DIAGNOSIS — E118 Type 2 diabetes mellitus with unspecified complications: Secondary | ICD-10-CM

## 2019-07-05 DIAGNOSIS — I10 Essential (primary) hypertension: Secondary | ICD-10-CM

## 2019-07-05 DIAGNOSIS — E785 Hyperlipidemia, unspecified: Secondary | ICD-10-CM

## 2019-07-05 MED ORDER — LISINOPRIL 40 MG PO TABS: 40.0000 mg | ORAL_TABLET | Freq: Every day | ORAL | 1 refills | Status: DC

## 2019-07-05 MED ORDER — JARDIANCE 10 MG PO TABS: 10.0000 mg | ORAL_TABLET | Freq: Every day | ORAL | 1 refills | Status: DC

## 2019-07-05 MED ORDER — ATORVASTATIN CALCIUM 80 MG PO TABS: 80.0000 mg | ORAL_TABLET | Freq: Every day | ORAL | 1 refills | Status: DC

## 2019-07-05 MED ORDER — METFORMIN HCL ER 500 MG PO TB24: 500.0000 mg | ORAL_TABLET | Freq: Two times a day (BID) | ORAL | 1 refills | Status: DC

## 2019-07-05 MED ORDER — AMLODIPINE BESYLATE 10 MG PO TABS: 10.0000 mg | ORAL_TABLET | Freq: Every day | ORAL | 1 refills | Status: DC

## 2019-07-05 MED ORDER — GLIPIZIDE ER 5 MG PO TB24: ORAL_TABLET | ORAL | 1 refills | Status: DC

## 2019-07-05 NOTE — Progress Notes (Signed)
   Subjective:    Patient ID: Fernando Vance, male    DOB: [birth year], 59 y.o.   MRN: [id number]  Diabetes He presents for his follow-up diabetic visit. He has type 2 diabetes mellitus. Risk factors for coronary artery disease include diabetes mellitus, dyslipidemia and hypertension. Current diabetic treatment includes oral agent (triple therapy). He is compliant with treatment all of the time. His weight is stable. He is following a diabetic diet.    Pt states stable will need refills and labs  Review of Systems Virtual Visit via Video Note  I connected with Fernando Vance on 07/05/19 at  9:00 AM EST by a video enabled telemedicine application and verified that I am speaking with the correct person using two identifiers.  Location: Patient: home Provider: office   I discussed the limitations of evaluation and management by telemedicine and the availability of in person appointments. The patient expressed understanding and agreed to proceed.  History of Present Illness:    Observations/Objective:   Assessment and Plan:   Follow Up Instructions:    I discussed the assessment and treatment plan with the patient. The patient was provided an opportunity to ask questions and all were answered. The patient agreed with the plan and demonstrated an understanding of the instructions.   The patient was advised to call back or seek an in-person evaluation if the symptoms worsen or if the condition fails to improve as anticipated.  I provided 22 minutes of non-face-to-face time during this encounter.        Objective:   Physical Exam  Today's visit was via telephone Physical exam was not possible for this visit       Assessment & Plan:  Patient with underlying diabetes does do a good job taking his medications.  Does need to do lab work.  States his blood pressure been under good control trying to stay safe he will do a comprehensive visit in the summer with a wellness  checkup and follow-up on his diabetes

## 2019-08-11 DIAGNOSIS — Z23 Encounter for immunization: Secondary | ICD-10-CM | POA: Diagnosis not present

## 2019-08-16 DIAGNOSIS — E1169 Type 2 diabetes mellitus with other specified complication: Secondary | ICD-10-CM | POA: Diagnosis not present

## 2019-08-16 DIAGNOSIS — E785 Hyperlipidemia, unspecified: Secondary | ICD-10-CM | POA: Diagnosis not present

## 2019-08-16 DIAGNOSIS — E118 Type 2 diabetes mellitus with unspecified complications: Secondary | ICD-10-CM | POA: Diagnosis not present

## 2019-08-16 DIAGNOSIS — I10 Essential (primary) hypertension: Secondary | ICD-10-CM | POA: Diagnosis not present

## 2019-08-17 LAB — HEPATIC FUNCTION PANEL
ALT: 18 IU/L (ref 0–44)
AST: 15 IU/L (ref 0–40)
Albumin: 4.7 g/dL (ref 3.8–4.9)
Alkaline Phosphatase: 103 IU/L (ref 39–117)
Bilirubin Total: 0.4 mg/dL (ref 0.0–1.2)
Bilirubin, Direct: 0.13 mg/dL (ref 0.00–0.40)
Total Protein: 7.5 g/dL (ref 6.0–8.5)

## 2019-08-17 LAB — BASIC METABOLIC PANEL
BUN/Creatinine Ratio: 13 (ref 9–20)
BUN: 16 mg/dL (ref 6–24)
CO2: 24 mmol/L (ref 20–29)
Calcium: 9.4 mg/dL (ref 8.7–10.2)
Chloride: 106 mmol/L (ref 96–106)
Creatinine, Ser: 1.22 mg/dL (ref 0.76–1.27)
GFR calc Af Amer: 76 mL/min/{1.73_m2} (ref >59)
GFR calc non Af Amer: 65 mL/min/{1.73_m2} (ref >59)
Glucose: 148 mg/dL — ABNORMAL HIGH (ref 65–99)
Potassium: 4.8 mmol/L (ref 3.5–5.2)
Sodium: 141 mmol/L (ref 134–144)

## 2019-08-17 LAB — CBC WITH DIFFERENTIAL/PLATELET
Basophils Absolute: 0 10*3/uL (ref 0.0–0.2)
Basos: 1 %
EOS (ABSOLUTE): 0.1 10*3/uL (ref 0.0–0.4)
Eos: 1 %
Hematocrit: 45.6 % (ref 37.5–51.0)
Hemoglobin: 14.1 g/dL (ref 13.0–17.7)
Immature Grans (Abs): 0 10*3/uL (ref 0.0–0.1)
Immature Granulocytes: 0 %
Lymphocytes Absolute: 1.6 10*3/uL (ref 0.7–3.1)
Lymphs: 30 %
MCH: 22.5 pg — ABNORMAL LOW (ref 26.6–33.0)
MCHC: 30.9 g/dL — ABNORMAL LOW (ref 31.5–35.7)
MCV: 73 fL — ABNORMAL LOW (ref 79–97)
Monocytes Absolute: 0.5 10*3/uL (ref 0.1–0.9)
Monocytes: 9 %
Neutrophils Absolute: 3.1 10*3/uL (ref 1.4–7.0)
Neutrophils: 59 %
Platelets: 342 10*3/uL (ref 150–450)
RBC: 6.26 x10E6/uL — ABNORMAL HIGH (ref 4.14–5.80)
RDW: 17.4 % — ABNORMAL HIGH (ref 11.6–15.4)
WBC: 5.3 10*3/uL (ref 3.4–10.8)

## 2019-08-17 LAB — HEMOGLOBIN A1C
Est. average glucose Bld gHb Est-mCnc: 171 mg/dL
Hgb A1c MFr Bld: 7.6 % — ABNORMAL HIGH (ref 4.8–5.6)

## 2019-08-17 LAB — LIPID PANEL
Chol/HDL Ratio: 3.4 ratio (ref 0.0–5.0)
Cholesterol, Total: 158 mg/dL (ref 100–199)
HDL: 47 mg/dL (ref >39)
LDL Chol Calc (NIH): 88 mg/dL (ref 0–99)
Triglycerides: 127 mg/dL (ref 0–149)
VLDL Cholesterol Cal: 23 mg/dL (ref 5–40)

## 2019-08-24 ENCOUNTER — Other Ambulatory Visit: Payer: Self-pay | Admitting: *Deleted

## 2019-08-24 DIAGNOSIS — E785 Hyperlipidemia, unspecified: Secondary | ICD-10-CM

## 2019-08-24 DIAGNOSIS — E1169 Type 2 diabetes mellitus with other specified complication: Secondary | ICD-10-CM

## 2019-08-24 DIAGNOSIS — I10 Essential (primary) hypertension: Secondary | ICD-10-CM

## 2019-08-24 DIAGNOSIS — Z125 Encounter for screening for malignant neoplasm of prostate: Secondary | ICD-10-CM

## 2019-08-24 DIAGNOSIS — E118 Type 2 diabetes mellitus with unspecified complications: Secondary | ICD-10-CM

## 2019-08-24 DIAGNOSIS — Z79899 Other long term (current) drug therapy: Secondary | ICD-10-CM

## 2019-08-24 MED ORDER — ROSUVASTATIN CALCIUM 40 MG PO TABS: 40.0000 mg | ORAL_TABLET | Freq: Every day | ORAL | 5 refills | Status: DC

## 2019-08-24 MED ORDER — EMPAGLIFLOZIN 25 MG PO TABS: 25.0000 mg | ORAL_TABLET | Freq: Every day | ORAL | 5 refills | Status: DC

## 2019-09-03 DIAGNOSIS — Z23 Encounter for immunization: Secondary | ICD-10-CM | POA: Diagnosis not present

## 2019-12-02 DIAGNOSIS — E118 Type 2 diabetes mellitus with unspecified complications: Secondary | ICD-10-CM | POA: Diagnosis not present

## 2019-12-02 DIAGNOSIS — I10 Essential (primary) hypertension: Secondary | ICD-10-CM | POA: Diagnosis not present

## 2019-12-02 DIAGNOSIS — E1169 Type 2 diabetes mellitus with other specified complication: Secondary | ICD-10-CM | POA: Diagnosis not present

## 2019-12-02 DIAGNOSIS — Z79899 Other long term (current) drug therapy: Secondary | ICD-10-CM | POA: Diagnosis not present

## 2019-12-02 DIAGNOSIS — E785 Hyperlipidemia, unspecified: Secondary | ICD-10-CM | POA: Diagnosis not present

## 2019-12-02 DIAGNOSIS — Z125 Encounter for screening for malignant neoplasm of prostate: Secondary | ICD-10-CM | POA: Diagnosis not present

## 2019-12-03 LAB — LIPID PANEL
Chol/HDL Ratio: 3.1 ratio (ref 0.0–5.0)
Cholesterol, Total: 150 mg/dL (ref 100–199)
HDL: 48 mg/dL (ref >39)
LDL Chol Calc (NIH): 79 mg/dL (ref 0–99)
Triglycerides: 127 mg/dL (ref 0–149)
VLDL Cholesterol Cal: 23 mg/dL (ref 5–40)

## 2019-12-03 LAB — HEPATIC FUNCTION PANEL
ALT: 16 IU/L (ref 0–44)
AST: 12 IU/L (ref 0–40)
Albumin: 4.7 g/dL (ref 3.8–4.9)
Alkaline Phosphatase: 92 IU/L (ref 48–121)
Bilirubin Total: 0.6 mg/dL (ref 0.0–1.2)
Bilirubin, Direct: 0.15 mg/dL (ref 0.00–0.40)
Total Protein: 7.7 g/dL (ref 6.0–8.5)

## 2019-12-03 LAB — BASIC METABOLIC PANEL
BUN/Creatinine Ratio: 17 (ref 9–20)
BUN: 20 mg/dL (ref 6–24)
CO2: 22 mmol/L (ref 20–29)
Calcium: 9.7 mg/dL (ref 8.7–10.2)
Chloride: 105 mmol/L (ref 96–106)
Creatinine, Ser: 1.19 mg/dL (ref 0.76–1.27)
GFR calc Af Amer: 78 mL/min/{1.73_m2} (ref >59)
GFR calc non Af Amer: 67 mL/min/{1.73_m2} (ref >59)
Glucose: 127 mg/dL — ABNORMAL HIGH (ref 65–99)
Potassium: 5.3 mmol/L — ABNORMAL HIGH (ref 3.5–5.2)
Sodium: 140 mmol/L (ref 134–144)

## 2019-12-03 LAB — PSA: Prostate Specific Ag, Serum: 0.5 ng/mL (ref 0.0–4.0)

## 2019-12-03 LAB — HEMOGLOBIN A1C
Est. average glucose Bld gHb Est-mCnc: 160 mg/dL
Hgb A1c MFr Bld: 7.2 % — ABNORMAL HIGH (ref 4.8–5.6)

## 2020-02-16 ENCOUNTER — Other Ambulatory Visit: Payer: Self-pay | Admitting: Family Medicine

## 2020-02-16 NOTE — Telephone Encounter (Signed)
lvm to schedule appt.  

## 2020-02-17 NOTE — Telephone Encounter (Signed)
lvm to schedule appt.  

## 2020-02-20 NOTE — Telephone Encounter (Signed)
lvm to schedule appt.  

## 2020-03-15 ENCOUNTER — Ambulatory Visit: Payer: BC Managed Care – PPO | Admitting: Family Medicine

## 2020-03-21 ENCOUNTER — Ambulatory Visit: Payer: BC Managed Care – PPO | Admitting: Family Medicine

## 2020-03-21 ENCOUNTER — Other Ambulatory Visit: Payer: Self-pay

## 2020-03-21 ENCOUNTER — Encounter: Payer: Self-pay | Admitting: Family Medicine

## 2020-03-21 VITALS — BP 140/90 | Temp 97.6°F | Ht 68.0 in | Wt 217.2 lb

## 2020-03-21 DIAGNOSIS — I10 Essential (primary) hypertension: Secondary | ICD-10-CM | POA: Diagnosis not present

## 2020-03-21 DIAGNOSIS — E118 Type 2 diabetes mellitus with unspecified complications: Secondary | ICD-10-CM | POA: Diagnosis not present

## 2020-03-21 DIAGNOSIS — Z23 Encounter for immunization: Secondary | ICD-10-CM | POA: Diagnosis not present

## 2020-03-21 NOTE — Progress Notes (Signed)
   Subjective:    Patient ID: Fernando Vance, male    DOB: March 15, 1961, 59 y.o.   MRN: [id number]  Hypertension This is a chronic problem. The current episode started more than 1 year ago. Pertinent negatives include no chest pain, headaches or shortness of breath. Risk factors for coronary artery disease include diabetes mellitus, dyslipidemia and male gender. Treatments tried: norvasc. There are no compliance problems.    Patient here for follow-up regarding cholesterol.  The patient does have hyperlipidemia.  Patient does try to maintain a reasonable diet.  Patient does take the medication on a regular basis.  Denies missing a dose.  The patient denies any obvious side effects.  Prior blood work results reviewed with the patient.  The patient is aware of his cholesterol goals and the need to keep it under good control to lessen the risk of disease.  The patient was seen today as part of a comprehensive diabetic check up.the patient does have diabetes.  The patient follows here on a regular basis.  The patient relates medication compliance. No significant side effects to the medications. Denies any low glucose spells. Relates compliance with diet to a reasonable level. Patient does do labwork intermittently and understands the dangers of diabetes.   Patient does state he watches his diet takes his medicines on a regular basis denies any setbacks   Review of Systems  Constitutional: Negative for diaphoresis and fatigue.  HENT: Negative for congestion and rhinorrhea.   Respiratory: Negative for cough and shortness of breath.   Cardiovascular: Negative for chest pain and leg swelling.  Gastrointestinal: Negative for abdominal pain and diarrhea.  Skin: Negative for color change and rash.  Neurological: Negative for dizziness and headaches.  Psychiatric/Behavioral: Negative for behavioral problems and confusion.       Objective:   Physical Exam Vitals reviewed.  Constitutional:       General: He is not in acute distress. HENT:     Head: Normocephalic and atraumatic.  Eyes:     General:        Right eye: No discharge.        Left eye: No discharge.  Neck:     Trachea: No tracheal deviation.  Cardiovascular:     Rate and Rhythm: Normal rate and regular rhythm.     Heart sounds: Normal heart sounds. No murmur heard.   Pulmonary:     Effort: Pulmonary effort is normal. No respiratory distress.     Breath sounds: Normal breath sounds.  Lymphadenopathy:     Cervical: No cervical adenopathy.  Skin:    General: Skin is warm and dry.  Neurological:     Mental Status: He is alert.     Coordination: Coordination normal.  Psychiatric:        Behavior: Behavior normal.           Assessment & Plan:  1. Controlled diabetes mellitus type 2 with complications, unspecified whether long term insulin use (HCC) Check A1c continue current medications watch diet - Basic metabolic panel - Hemoglobin A1c - Microalbumin / creatinine urine ratio  2. HTN (hypertension), benign Blood pressure good control continue current measures Patient to follow-up for a no charge follow-up visit to recheck blood pressure may need adjustment in medication - Basic metabolic panel  3. Need for vaccination Flu shot today  Follow-up 6 months - Flu Vaccine QUAD 36+ mos IM

## 2020-03-30 DIAGNOSIS — E118 Type 2 diabetes mellitus with unspecified complications: Secondary | ICD-10-CM | POA: Diagnosis not present

## 2020-03-30 DIAGNOSIS — I10 Essential (primary) hypertension: Secondary | ICD-10-CM | POA: Diagnosis not present

## 2020-03-31 LAB — BASIC METABOLIC PANEL
BUN/Creatinine Ratio: 17 (ref 9–20)
BUN: 21 mg/dL (ref 6–24)
CO2: 21 mmol/L (ref 20–29)
Calcium: 9.6 mg/dL (ref 8.7–10.2)
Chloride: 105 mmol/L (ref 96–106)
Creatinine, Ser: 1.24 mg/dL (ref 0.76–1.27)
GFR calc Af Amer: 74 mL/min/{1.73_m2} (ref >59)
GFR calc non Af Amer: 64 mL/min/{1.73_m2} (ref >59)
Glucose: 142 mg/dL — ABNORMAL HIGH (ref 65–99)
Potassium: 5.3 mmol/L — ABNORMAL HIGH (ref 3.5–5.2)
Sodium: 142 mmol/L (ref 134–144)

## 2020-03-31 LAB — MICROALBUMIN / CREATININE URINE RATIO
Creatinine, Urine: 55.8 mg/dL
Microalb/Creat Ratio: 32 mg/g creat — ABNORMAL HIGH (ref 0–29)
Microalbumin, Urine: 17.6 ug/mL

## 2020-03-31 LAB — HEMOGLOBIN A1C
Est. average glucose Bld gHb Est-mCnc: 171 mg/dL
Hgb A1c MFr Bld: 7.6 % — ABNORMAL HIGH (ref 4.8–5.6)

## 2020-04-04 ENCOUNTER — Other Ambulatory Visit: Payer: Self-pay

## 2020-04-04 ENCOUNTER — Ambulatory Visit: Payer: BC Managed Care – PPO | Admitting: Family Medicine

## 2020-04-04 ENCOUNTER — Encounter: Payer: Self-pay | Admitting: Family Medicine

## 2020-04-04 VITALS — BP 138/88 | HR 113 | Temp 97.6°F | Ht 68.0 in | Wt 217.0 lb

## 2020-04-04 DIAGNOSIS — I10 Essential (primary) hypertension: Secondary | ICD-10-CM

## 2020-04-04 MED ORDER — DOXAZOSIN MESYLATE 2 MG PO TABS: 2.0000 mg | ORAL_TABLET | Freq: Every day | ORAL | 4 refills | Status: DC

## 2020-04-04 NOTE — Patient Instructions (Signed)
DASH Eating Plan DASH stands for "Dietary Approaches to Stop Hypertension." The DASH eating plan is a healthy eating plan that has been shown to reduce high blood pressure (hypertension). It may also reduce your risk for type 2 diabetes, heart disease, and stroke. The DASH eating plan may also help with weight loss. What are tips for following this plan?  General guidelines  Avoid eating more than 2,300 mg (milligrams) of salt (sodium) a day. If you have hypertension, you may need to reduce your sodium intake to 1,500 mg a day.  Limit alcohol intake to no more than 1 drink a day for nonpregnant women and 2 drinks a day for men. One drink equals 12 oz of beer, 5 oz of wine, or 1 oz of hard liquor.  Work with your health care provider to maintain a healthy body weight or to lose weight. Ask what an ideal weight is for you.  Get at least 30 minutes of exercise that causes your heart to beat faster (aerobic exercise) most days of the week. Activities may include walking, swimming, or biking.  Work with your health care provider or diet and nutrition specialist (dietitian) to adjust your eating plan to your individual calorie needs. Reading food labels   Check food labels for the amount of sodium per serving. Choose foods with less than 5 percent of the Daily Value of sodium. Generally, foods with less than 300 mg of sodium per serving fit into this eating plan.  To find whole grains, look for the word "whole" as the first word in the ingredient list. Shopping  Buy products labeled as "low-sodium" or "no salt added."  Buy fresh foods. Avoid canned foods and premade or frozen meals. Cooking  Avoid adding salt when cooking. Use salt-free seasonings or herbs instead of table salt or sea salt. Check with your health care provider or pharmacist before using salt substitutes.  Do not fry foods. Cook foods using healthy methods such as baking, boiling, grilling, and broiling instead.  Cook with  heart-healthy oils, such as olive, canola, soybean, or sunflower oil. Meal planning  Eat a balanced diet that includes: ? 5 or more servings of fruits and vegetables each day. At each meal, try to fill half of your plate with fruits and vegetables. ? Up to 6-8 servings of whole grains each day. ? Less than 6 oz of lean meat, poultry, or fish each day. A 3-oz serving of meat is about the same size as a deck of cards. One egg equals 1 oz. ? 2 servings of low-fat dairy each day. ? A serving of nuts, seeds, or beans 5 times each week. ? Heart-healthy fats. Healthy fats called Omega-3 fatty acids are found in foods such as flaxseeds and coldwater fish, like sardines, salmon, and mackerel.  Limit how much you eat of the following: ? Canned or prepackaged foods. ? Food that is high in trans fat, such as fried foods. ? Food that is high in saturated fat, such as fatty meat. ? Sweets, desserts, sugary drinks, and other foods with added sugar. ? Full-fat dairy products.  Do not salt foods before eating.  Try to eat at least 2 vegetarian meals each week.  Eat more home-cooked food and less restaurant, buffet, and fast food.  When eating at a restaurant, ask that your food be prepared with less salt or no salt, if possible. What foods are recommended? The items listed may not be a complete list. Talk with your dietitian about   what dietary choices are best for you. Grains Whole-grain or whole-wheat bread. Whole-grain or whole-wheat pasta. Brown rice. Oatmeal. Quinoa. Bulgur. Whole-grain and low-sodium cereals. Pita bread. Low-fat, low-sodium crackers. Whole-wheat flour tortillas. Vegetables Fresh or frozen vegetables (raw, steamed, roasted, or grilled). Low-sodium or reduced-sodium tomato and vegetable juice. Low-sodium or reduced-sodium tomato sauce and tomato paste. Low-sodium or reduced-sodium canned vegetables. Fruits All fresh, dried, or frozen fruit. Canned fruit in natural juice (without  added sugar). Meat and other protein foods Skinless chicken or turkey. Ground chicken or turkey. Pork with fat trimmed off. Fish and seafood. Egg whites. Dried beans, peas, or lentils. Unsalted nuts, nut butters, and seeds. Unsalted canned beans. Lean cuts of beef with fat trimmed off. Low-sodium, lean deli meat. Dairy Low-fat (1%) or fat-free (skim) milk. Fat-free, low-fat, or reduced-fat cheeses. Nonfat, low-sodium ricotta or cottage cheese. Low-fat or nonfat yogurt. Low-fat, low-sodium cheese. Fats and oils Soft margarine without trans fats. Vegetable oil. Low-fat, reduced-fat, or light mayonnaise and salad dressings (reduced-sodium). Canola, safflower, olive, soybean, and sunflower oils. Avocado. Seasoning and other foods Herbs. Spices. Seasoning mixes without salt. Unsalted popcorn and pretzels. Fat-free sweets. What foods are not recommended? The items listed may not be a complete list. Talk with your dietitian about what dietary choices are best for you. Grains Baked goods made with fat, such as croissants, muffins, or some breads. Dry pasta or rice meal packs. Vegetables Creamed or fried vegetables. Vegetables in a cheese sauce. Regular canned vegetables (not low-sodium or reduced-sodium). Regular canned tomato sauce and paste (not low-sodium or reduced-sodium). Regular tomato and vegetable juice (not low-sodium or reduced-sodium). Pickles. Olives. Fruits Canned fruit in a light or heavy syrup. Fried fruit. Fruit in cream or butter sauce. Meat and other protein foods Fatty cuts of meat. Ribs. Fried meat. Bacon. Sausage. Bologna and other processed lunch meats. Salami. Fatback. Hotdogs. Bratwurst. Salted nuts and seeds. Canned beans with added salt. Canned or smoked fish. Whole eggs or egg yolks. Chicken or turkey with skin. Dairy Whole or 2% milk, cream, and half-and-half. Whole or full-fat cream cheese. Whole-fat or sweetened yogurt. Full-fat cheese. Nondairy creamers. Whipped toppings.  Processed cheese and cheese spreads. Fats and oils Butter. Stick margarine. Lard. Shortening. Ghee. Bacon fat. Tropical oils, such as coconut, palm kernel, or palm oil. Seasoning and other foods Salted popcorn and pretzels. Onion salt, garlic salt, seasoned salt, table salt, and sea salt. Worcestershire sauce. Tartar sauce. Barbecue sauce. Teriyaki sauce. Soy sauce, including reduced-sodium. Steak sauce. Canned and packaged gravies. Fish sauce. Oyster sauce. Cocktail sauce. Horseradish that you find on the shelf. Ketchup. Mustard. Meat flavorings and tenderizers. Bouillon cubes. Hot sauce and Tabasco sauce. Premade or packaged marinades. Premade or packaged taco seasonings. Relishes. Regular salad dressings. Where to find more information:  National Heart, Lung, and Blood Institute: www.nhlbi.nih.gov  American Heart Association: www.heart.org Summary  The DASH eating plan is a healthy eating plan that has been shown to reduce high blood pressure (hypertension). It may also reduce your risk for type 2 diabetes, heart disease, and stroke.  With the DASH eating plan, you should limit salt (sodium) intake to 2,300 mg a day. If you have hypertension, you may need to reduce your sodium intake to 1,500 mg a day.  When on the DASH eating plan, aim to eat more fresh fruits and vegetables, whole grains, lean proteins, low-fat dairy, and heart-healthy fats.  Work with your health care provider or diet and nutrition specialist (dietitian) to adjust your eating plan to your   individual calorie needs. This information is not intended to replace advice given to you by your health care provider. Make sure you discuss any questions you have with your health care provider. Document Revised: 04/17/2017 Document Reviewed: 04/28/2016 Elsevier Patient Education  2020 [employer] Inc.  

## 2020-04-04 NOTE — Progress Notes (Signed)
   Subjective:    Patient ID: Fernando Vance, male    DOB: 23-Dec-1960, 59 y.o.   MRN: [id number]  HPIhtn check up.  Patient taking his medications Watching his diet Staying active    Review of Systems     Objective:   Physical Exam  Lungs clear heart regular pulse normal blood pressure was checked still in the upper range of normal.      Assessment & Plan:  Blood pressure slightly elevated Add Cardura 2 mg each evening Continue other medicines Follow-up as planned in several weeks for recheck

## 2020-04-16 ENCOUNTER — Other Ambulatory Visit: Payer: Self-pay | Admitting: Family Medicine

## 2020-05-02 ENCOUNTER — Encounter: Payer: Self-pay | Admitting: Family Medicine

## 2020-05-02 ENCOUNTER — Other Ambulatory Visit: Payer: Self-pay

## 2020-05-02 ENCOUNTER — Ambulatory Visit: Payer: BC Managed Care – PPO | Admitting: Family Medicine

## 2020-05-02 VITALS — BP 140/82 | HR 105 | Temp 98.0°F | Ht 68.0 in | Wt 217.0 lb

## 2020-05-02 DIAGNOSIS — I10 Essential (primary) hypertension: Secondary | ICD-10-CM

## 2020-05-02 DIAGNOSIS — E1169 Type 2 diabetes mellitus with other specified complication: Secondary | ICD-10-CM

## 2020-05-02 DIAGNOSIS — E785 Hyperlipidemia, unspecified: Secondary | ICD-10-CM

## 2020-05-02 MED ORDER — VALSARTAN-HYDROCHLOROTHIAZIDE 80-12.5 MG PO TABS
1.0000 | ORAL_TABLET | Freq: Every day | ORAL | 5 refills | Status: DC
Start: 2020-05-02 — End: 2020-05-30

## 2020-05-02 NOTE — Patient Instructions (Signed)

## 2020-05-02 NOTE — Progress Notes (Signed)
   Subjective:    Patient ID: Fernando Vance, male    DOB: 05/15/1961, 59 y.o.   MRN: [id number]  HPIpt arrives today to follow up on HTN. BP meds were changed at last visit.  Pt states no other concerns.  The patient was seen today as part of a comprehensive diabetic check up.the patient does have diabetes.  The patient follows here on a regular basis.  The patient relates medication compliance. No significant side effects to the medications. Denies any low glucose spells. Relates compliance with diet to a reasonable level. Patient does do labwork intermittently and understands the dangers of diabetes.   Patient for blood pressure check up.  The patient does have hypertension.  The patient is on medication.  Patient relates compliance with meds. Todays BP reviewed with the patient. Patient denies issues with medication. Patient relates reasonable diet. Patient tries to minimize salt. Patient aware of BP goals.    Review of Systems  Constitutional: Negative for diaphoresis and fatigue.  HENT: Negative for congestion and rhinorrhea.   Respiratory: Negative for cough and shortness of breath.   Cardiovascular: Negative for chest pain and leg swelling.  Gastrointestinal: Negative for abdominal pain and diarrhea.  Skin: Negative for color change and rash.  Neurological: Negative for dizziness and headaches.  Psychiatric/Behavioral: Negative for behavioral problems and confusion.       Objective:   Physical Exam Lungs clear heart regular HEENT benign  Blood pressure borderline Lab work to check the electrolytes and creatinine with the change in medication in 3 weeks    Assessment & Plan:  Change medication to valsartan with HCTZ Follow-up within 6 weeks Follow-up sooner problems Minimize salt Consume water avoid sugary drinks avoid caffeinated drinks Fit in walking on a regular basis

## 2020-05-18 ENCOUNTER — Other Ambulatory Visit: Payer: Self-pay | Admitting: Family Medicine

## 2020-05-25 DIAGNOSIS — Z23 Encounter for immunization: Secondary | ICD-10-CM | POA: Diagnosis not present

## 2020-05-25 DIAGNOSIS — I10 Essential (primary) hypertension: Secondary | ICD-10-CM | POA: Diagnosis not present

## 2020-05-26 LAB — BASIC METABOLIC PANEL
BUN/Creatinine Ratio: 18 (ref 9–20)
BUN: 22 mg/dL (ref 6–24)
CO2: 23 mmol/L (ref 20–29)
Calcium: 9.2 mg/dL (ref 8.7–10.2)
Chloride: 103 mmol/L (ref 96–106)
Creatinine, Ser: 1.22 mg/dL (ref 0.76–1.27)
GFR calc Af Amer: 75 mL/min/{1.73_m2} (ref >59)
GFR calc non Af Amer: 65 mL/min/{1.73_m2} (ref >59)
Glucose: 138 mg/dL — ABNORMAL HIGH (ref 65–99)
Potassium: 4.6 mmol/L (ref 3.5–5.2)
Sodium: 144 mmol/L (ref 134–144)

## 2020-05-30 ENCOUNTER — Other Ambulatory Visit: Payer: Self-pay

## 2020-05-30 ENCOUNTER — Encounter: Payer: Self-pay | Admitting: Family Medicine

## 2020-05-30 ENCOUNTER — Ambulatory Visit: Payer: BC Managed Care – PPO | Admitting: Family Medicine

## 2020-05-30 VITALS — BP 128/72 | HR 70 | Temp 98.1°F | Ht 68.0 in | Wt 216.0 lb

## 2020-05-30 DIAGNOSIS — E785 Hyperlipidemia, unspecified: Secondary | ICD-10-CM | POA: Diagnosis not present

## 2020-05-30 DIAGNOSIS — E1169 Type 2 diabetes mellitus with other specified complication: Secondary | ICD-10-CM | POA: Diagnosis not present

## 2020-05-30 DIAGNOSIS — I10 Essential (primary) hypertension: Secondary | ICD-10-CM

## 2020-05-30 DIAGNOSIS — E118 Type 2 diabetes mellitus with unspecified complications: Secondary | ICD-10-CM | POA: Diagnosis not present

## 2020-05-30 MED ORDER — ROSUVASTATIN CALCIUM 40 MG PO TABS: 40.0000 mg | ORAL_TABLET | Freq: Every day | ORAL | 1 refills | Status: DC

## 2020-05-30 MED ORDER — DOXAZOSIN MESYLATE 2 MG PO TABS: 2.0000 mg | ORAL_TABLET | Freq: Every day | ORAL | 1 refills | Status: DC

## 2020-05-30 MED ORDER — METFORMIN HCL ER 500 MG PO TB24
500.0000 mg | ORAL_TABLET | Freq: Two times a day (BID) | ORAL | 1 refills | Status: DC
Start: 2020-05-30 — End: 2020-10-17

## 2020-05-30 MED ORDER — AMLODIPINE BESYLATE 10 MG PO TABS: 10.0000 mg | ORAL_TABLET | Freq: Every day | ORAL | 1 refills | Status: DC

## 2020-05-30 MED ORDER — GLIPIZIDE ER 5 MG PO TB24: ORAL_TABLET | ORAL | 1 refills | Status: DC

## 2020-05-30 MED ORDER — EMPAGLIFLOZIN 25 MG PO TABS: 25.0000 mg | ORAL_TABLET | Freq: Every day | ORAL | 1 refills | Status: DC

## 2020-05-30 MED ORDER — VALSARTAN-HYDROCHLOROTHIAZIDE 80-12.5 MG PO TABS: 1.0000 | ORAL_TABLET | Freq: Every day | ORAL | 1 refills | Status: DC

## 2020-05-30 NOTE — Patient Instructions (Addendum)
Results for orders placed or performed in visit on 28/78/67  Basic metabolic panel  Result Value Ref Range   Glucose 138 (H) 65 - 99 mg/dL   BUN 22 6 - 24 mg/dL   Creatinine, Ser 1.22 0.76 - 1.27 mg/dL   GFR calc non Af Amer 65 >59 mL/min/1.73   GFR calc Af Amer 75 >59 mL/min/1.73   BUN/Creatinine Ratio 18 9 - 20   Sodium 144 134 - 144 mmol/L   Potassium 4.6 3.5 - 5.2 mmol/L   Chloride 103 96 - 106 mmol/L   CO2 23 20 - 29 mmol/L   Calcium 9.2 8.7 - 10.2 mg/dL  do your labs one week before your visit  Your pressure looks great!! See you in 4 months

## 2020-05-30 NOTE — Progress Notes (Signed)
   Subjective:    Patient ID: Fernando Vance, male    DOB: [birth year], 60 y.o.   MRN: [id number]  HPIFollow up on blood pressure. Taking amlodipine, cardura and diovan-hct for bp. Pt states no concerns today.  Very nice patient here for follow-up regarding his medication change denies any problems or setbacks. Doing a good job watching his diet and staying active   Review of Systems  Constitutional: Negative for diaphoresis and fatigue.  HENT: Negative for congestion and rhinorrhea.   Respiratory: Negative for cough and shortness of breath.   Cardiovascular: Negative for chest pain and leg swelling.  Gastrointestinal: Negative for abdominal pain and diarrhea.  Skin: Negative for color change and rash.  Neurological: Negative for dizziness and headaches.  Psychiatric/Behavioral: Negative for behavioral problems and confusion.       Objective:   Physical Exam  Lungs clear heart regular HEENT benign      Assessment & Plan:  Blood pressure very good Hypertension continue current medicines check lab work and a follow-up visit in approximately 4 months

## 2020-10-03 ENCOUNTER — Ambulatory Visit: Payer: BC Managed Care – PPO | Admitting: Family Medicine

## 2020-10-12 DIAGNOSIS — E1169 Type 2 diabetes mellitus with other specified complication: Secondary | ICD-10-CM | POA: Diagnosis not present

## 2020-10-12 DIAGNOSIS — E118 Type 2 diabetes mellitus with unspecified complications: Secondary | ICD-10-CM | POA: Diagnosis not present

## 2020-10-12 DIAGNOSIS — I10 Essential (primary) hypertension: Secondary | ICD-10-CM | POA: Diagnosis not present

## 2020-10-12 DIAGNOSIS — E785 Hyperlipidemia, unspecified: Secondary | ICD-10-CM | POA: Diagnosis not present

## 2020-10-13 LAB — COMPREHENSIVE METABOLIC PANEL
ALT: 17 IU/L (ref 0–44)
AST: 15 IU/L (ref 0–40)
Albumin/Globulin Ratio: 1.6 (ref 1.2–2.2)
Albumin: 4.6 g/dL (ref 3.8–4.9)
Alkaline Phosphatase: 90 IU/L (ref 44–121)
BUN/Creatinine Ratio: 15 (ref 9–20)
BUN: 21 mg/dL (ref 6–24)
Bilirubin Total: 0.5 mg/dL (ref 0.0–1.2)
CO2: 23 mmol/L (ref 20–29)
Calcium: 9.4 mg/dL (ref 8.7–10.2)
Chloride: 104 mmol/L (ref 96–106)
Creatinine, Ser: 1.42 mg/dL — ABNORMAL HIGH (ref 0.76–1.27)
Globulin, Total: 2.9 g/dL (ref 1.5–4.5)
Glucose: 131 mg/dL — ABNORMAL HIGH (ref 65–99)
Potassium: 4.6 mmol/L (ref 3.5–5.2)
Sodium: 143 mmol/L (ref 134–144)
Total Protein: 7.5 g/dL (ref 6.0–8.5)
eGFR: 57 mL/min/{1.73_m2} — ABNORMAL LOW (ref >59)

## 2020-10-13 LAB — LIPID PANEL
Chol/HDL Ratio: 2.9 ratio (ref 0.0–5.0)
Cholesterol, Total: 154 mg/dL (ref 100–199)
HDL: 54 mg/dL (ref >39)
LDL Chol Calc (NIH): 85 mg/dL (ref 0–99)
Triglycerides: 76 mg/dL (ref 0–149)
VLDL Cholesterol Cal: 15 mg/dL (ref 5–40)

## 2020-10-13 LAB — HEMOGLOBIN A1C
Est. average glucose Bld gHb Est-mCnc: 160 mg/dL
Hgb A1c MFr Bld: 7.2 % — ABNORMAL HIGH (ref 4.8–5.6)

## 2020-10-17 ENCOUNTER — Ambulatory Visit: Payer: BC Managed Care – PPO | Admitting: Family Medicine

## 2020-10-17 ENCOUNTER — Encounter: Payer: Self-pay | Admitting: Family Medicine

## 2020-10-17 ENCOUNTER — Other Ambulatory Visit: Payer: Self-pay

## 2020-10-17 VITALS — BP 129/82 | HR 95 | Temp 98.1°F | Ht 68.0 in | Wt 216.0 lb

## 2020-10-17 DIAGNOSIS — R42 Dizziness and giddiness: Secondary | ICD-10-CM

## 2020-10-17 DIAGNOSIS — Z125 Encounter for screening for malignant neoplasm of prostate: Secondary | ICD-10-CM

## 2020-10-17 DIAGNOSIS — E118 Type 2 diabetes mellitus with unspecified complications: Secondary | ICD-10-CM

## 2020-10-17 DIAGNOSIS — E1169 Type 2 diabetes mellitus with other specified complication: Secondary | ICD-10-CM

## 2020-10-17 DIAGNOSIS — I10 Essential (primary) hypertension: Secondary | ICD-10-CM

## 2020-10-17 DIAGNOSIS — N529 Male erectile dysfunction, unspecified: Secondary | ICD-10-CM | POA: Diagnosis not present

## 2020-10-17 DIAGNOSIS — E785 Hyperlipidemia, unspecified: Secondary | ICD-10-CM

## 2020-10-17 MED ORDER — AMLODIPINE BESYLATE 10 MG PO TABS: 10.0000 mg | ORAL_TABLET | Freq: Every day | ORAL | 1 refills | Status: DC

## 2020-10-17 MED ORDER — GLIPIZIDE ER 5 MG PO TB24: ORAL_TABLET | ORAL | 1 refills | Status: DC

## 2020-10-17 MED ORDER — METFORMIN HCL ER 500 MG PO TB24: 500.0000 mg | ORAL_TABLET | Freq: Two times a day (BID) | ORAL | 1 refills | Status: DC

## 2020-10-17 MED ORDER — ROSUVASTATIN CALCIUM 40 MG PO TABS: 40.0000 mg | ORAL_TABLET | Freq: Every day | ORAL | 1 refills | Status: DC

## 2020-10-17 MED ORDER — EMPAGLIFLOZIN 25 MG PO TABS: 25.0000 mg | ORAL_TABLET | Freq: Every day | ORAL | 1 refills | Status: DC

## 2020-10-17 MED ORDER — VALSARTAN-HYDROCHLOROTHIAZIDE 80-12.5 MG PO TABS: 1.0000 | ORAL_TABLET | Freq: Every day | ORAL | 1 refills | Status: DC

## 2020-10-17 MED ORDER — DOXAZOSIN MESYLATE 2 MG PO TABS: 2.0000 mg | ORAL_TABLET | Freq: Every day | ORAL | 1 refills | Status: DC

## 2020-10-17 MED ORDER — SILDENAFIL CITRATE 100 MG PO TABS: 50.0000 mg | ORAL_TABLET | Freq: Every day | ORAL | 11 refills | Status: DC | PRN

## 2020-10-17 NOTE — Progress Notes (Signed)
Subjective:    Patient ID: Fernando Vance, male    DOB: May 08, 1961, 60 y.o.   MRN: [id number]  Diabetes He presents for his follow-up diabetic visit. He has type 2 diabetes mellitus. He is following a generally healthy diet. He never participates in exercise. He does not see a podiatrist.Eye exam is not current.    htn Patient for blood pressure check up.  The patient does have hypertension.  The patient is on medication.  Patient relates compliance with meds. Todays BP reviewed with the patient. Patient denies issues with medication. Patient relates reasonable diet. Patient tries to minimize salt. Patient aware of BP goals.  Patient here for follow-up regarding cholesterol.  The patient does have hyperlipidemia.  Patient does try to maintain a reasonable diet.  Patient does take the medication on a regular basis.  Denies missing a dose.  The patient denies any obvious side effects.  Prior blood work results reviewed with the patient.  The patient is aware of his cholesterol goals and the need to keep it under good control to lessen the risk of disease.  Felt dizzy for 2 to 3 sec then resolved Patient states this happened at work he improved without any other major intervention they checked his glucose blood pressure everything was fine it was very hot at work  Results for orders placed or performed in visit on 05/30/20  Lipid panel  Result Value Ref Range   Cholesterol, Total 154 100 - 199 mg/dL   Triglycerides 76 0 - 149 mg/dL   HDL 54 >39 mg/dL   VLDL Cholesterol Cal 15 5 - 40 mg/dL   LDL Chol Calc (NIH) 85 0 - 99 mg/dL   Chol/HDL Ratio 2.9 0.0 - 5.0 ratio  Comprehensive metabolic panel  Result Value Ref Range   Glucose 131 (H) 65 - 99 mg/dL   BUN 21 6 - 24 mg/dL   Creatinine, Ser 1.42 (H) 0.76 - 1.27 mg/dL   eGFR 57 (L) >59 mL/min/1.73   BUN/Creatinine Ratio 15 9 - 20   Sodium 143 134 - 144 mmol/L   Potassium 4.6 3.5 - 5.2 mmol/L   Chloride 104 96 - 106 mmol/L   CO2 23 20  - 29 mmol/L   Calcium 9.4 8.7 - 10.2 mg/dL   Total Protein 7.5 6.0 - 8.5 g/dL   Albumin 4.6 3.8 - 4.9 g/dL   Globulin, Total 2.9 1.5 - 4.5 g/dL   Albumin/Globulin Ratio 1.6 1.2 - 2.2   Bilirubin Total 0.5 0.0 - 1.2 mg/dL   Alkaline Phosphatase 90 44 - 121 IU/L   AST 15 0 - 40 IU/L   ALT 17 0 - 44 IU/L  Hemoglobin A1c  Result Value Ref Range   Hgb A1c MFr Bld 7.2 (H) 4.8 - 5.6 %   Est. average glucose Bld gHb Est-mCnc 160 mg/dL      Review of Systems     Objective:   Physical Exam Lungs clear heart regular pulse normal BP good extremities no edema blood work reviewed in detail       Assessment & Plan:  1. HTN (hypertension), benign Blood pressure good control previous labs look good taking his medicine watching diet - Comprehensive metabolic panel  2. Hyperlipidemia associated with type 2 diabetes mellitus (HCC) Cholesterol fairly good control LDL could be better taking his medicine watching diet encourage him to be more active on his days off - Lipid panel  3. Erectile dysfunction, unspecified erectile dysfunction type We did  discuss his safety and side effects of Viagra generic was prescribed patient having moderate erectile dysfunction he thought it was related to injuring his back when he lifted a heavy item but I do not feel that that is so I believe that it is related to his diabetes and age if he has further trouble he will let us know  4. Dizzy On today's exam no sign of any type of seizure or stroke or other problem more than likely related to excessive heat stay well-hydrated  5. Screening for prostate cancer PSA screening before next visit wellness on next visit - PSA  6. Controlled diabetes mellitus type 2 with complications, unspecified whether long term insulin use ([place]) Diabetes decent control.  Very important to watch diet stay active continue medication.  Follow-up if progressive troubles. - Microalbumin / creatinine urine ratio - Hemoglobin  A1c  Creatinine slightly elevated we will relook at kidney function by fall time if it does not look better then he is developing chronic kidney disease related to blood pressure and diabetes healthy diet recommended

## 2020-10-17 NOTE — Patient Instructions (Signed)
Diabetes Mellitus and Nutrition, Adult When you have diabetes, or diabetes mellitus, it is very important to have healthy eating habits because your blood sugar (glucose) levels are greatly affected by what you eat and drink. Eating healthy foods in the right amounts, at about the same times every day, can help you:  Control your blood glucose.  Lower your risk of heart disease.  Improve your blood pressure.  Reach or maintain a healthy weight. What can affect my meal plan? Every person with diabetes is different, and each person has different needs for a meal plan. Your health care provider may recommend that you work with a dietitian to make a meal plan that is best for you. Your meal plan may vary depending on factors such as:  The calories you need.  The medicines you take.  Your weight.  Your blood glucose, blood pressure, and cholesterol levels.  Your activity level.  Other health conditions you have, such as heart or kidney disease. How do carbohydrates affect me? Carbohydrates, also called carbs, affect your blood glucose level more than any other type of food. Eating carbs naturally raises the amount of glucose in your blood. Carb counting is a method for keeping track of how many carbs you eat. Counting carbs is important to keep your blood glucose at a healthy level, especially if you use insulin or take certain oral diabetes medicines. It is important to know how many carbs you can safely have in each meal. This is different for every person. Your dietitian can help you calculate how many carbs you should have at each meal and for each snack. How does alcohol affect me? Alcohol can cause a sudden decrease in blood glucose (hypoglycemia), especially if you use insulin or take certain oral diabetes medicines. Hypoglycemia can be a life-threatening condition. Symptoms of hypoglycemia, such as sleepiness, dizziness, and confusion, are similar to symptoms of having too much  alcohol.  Do not drink alcohol if: ? Your health care provider tells you not to drink. ? You are pregnant, may be pregnant, or are planning to become pregnant.  If you drink alcohol: ? Do not drink on an empty stomach. ? Limit how much you use to:  0-1 drink a day for women.  0-2 drinks a day for men. ? Be aware of how much alcohol is in your drink. In the [place] one drink equals one 12 oz bottle of beer (355 mL), one 5 oz glass of wine (148 mL), or one 1 oz glass of hard liquor (44 mL). ? Keep yourself hydrated with water, diet soda, or unsweetened iced tea.  Keep in mind that regular soda, juice, and other mixers may contain a lot of sugar and must be counted as carbs. What are tips for following this plan? Reading food labels  Start by checking the serving size on the "Nutrition Facts" label of packaged foods and drinks. The amount of calories, carbs, fats, and other nutrients listed on the label is based on one serving of the item. Many items contain more than one serving per package.  Check the total grams (g) of carbs in one serving. You can calculate the number of servings of carbs in one serving by dividing the total carbs by 15. For example, if a food has 30 g of total carbs per serving, it would be equal to 2 servings of carbs.  Check the number of grams (g) of saturated fats and trans fats in one serving. Choose foods that have   a low amount or none of these fats.  Check the number of milligrams (mg) of salt (sodium) in one serving. Most people should limit total sodium intake to less than 2,300 mg per day.  Always check the nutrition information of foods labeled as "low-fat" or "nonfat." These foods may be higher in added sugar or refined carbs and should be avoided.  Talk to your dietitian to identify your daily goals for nutrients listed on the label. Shopping  Avoid buying canned, pre-made, or processed foods. These foods tend to be high in fat, sodium, and added  sugar.  Shop around the outside edge of the grocery store. This is where you will most often find fresh fruits and vegetables, bulk grains, fresh meats, and fresh dairy. Cooking  Use low-heat cooking methods, such as baking, instead of high-heat cooking methods like deep frying.  Cook using healthy oils, such as olive, canola, or sunflower oil.  Avoid cooking with butter, cream, or high-fat meats. Meal planning  Eat meals and snacks regularly, preferably at the same times every day. Avoid going long periods of time without eating.  Eat foods that are high in fiber, such as fresh fruits, vegetables, beans, and whole grains. Talk with your dietitian about how many servings of carbs you can eat at each meal.  Eat 4-6 oz (112-168 g) of lean protein each day, such as lean meat, chicken, fish, eggs, or tofu. One ounce (oz) of lean protein is equal to: ? 1 oz (28 g) of meat, chicken, or fish. ? 1 egg. ?  cup (62 g) of tofu.  Eat some foods each day that contain healthy fats, such as avocado, nuts, seeds, and fish.   What foods should I eat? Fruits Berries. Apples. Oranges. Peaches. Apricots. Plums. Grapes. Mango. Papaya. Pomegranate. Kiwi. Cherries. Vegetables Lettuce. Spinach. Leafy greens, including kale, chard, collard greens, and mustard greens. Beets. Cauliflower. Cabbage. Broccoli. Carrots. Green beans. Tomatoes. Peppers. Onions. Cucumbers. Brussels sprouts. Grains Whole grains, such as whole-wheat or whole-grain bread, crackers, tortillas, cereal, and pasta. Unsweetened oatmeal. Quinoa. Brown or wild rice. Meats and other proteins Seafood. Poultry without skin. Lean cuts of poultry and beef. Tofu. Nuts. Seeds. Dairy Low-fat or fat-free dairy products such as milk, yogurt, and cheese. The items listed above may not be a complete list of foods and beverages you can eat. Contact a dietitian for more information. What foods should I avoid? Fruits Fruits canned with  syrup. Vegetables Canned vegetables. Frozen vegetables with butter or cream sauce. Grains Refined white flour and flour products such as bread, pasta, snack foods, and cereals. Avoid all processed foods. Meats and other proteins Fatty cuts of meat. Poultry with skin. Breaded or fried meats. Processed meat. Avoid saturated fats. Dairy Full-fat yogurt, cheese, or milk. Beverages Sweetened drinks, such as soda or iced tea. The items listed above may not be a complete list of foods and beverages you should avoid. Contact a dietitian for more information. Questions to ask a health care provider  Do I need to meet with a diabetes educator?  Do I need to meet with a dietitian?  What number can I call if I have questions?  When are the best times to check my blood glucose? Where to find more information:  American Diabetes Association: diabetes.org  Academy of Nutrition and Dietetics: www.eatright.org  National Institute of Diabetes and Digestive and Kidney Diseases: www.niddk.nih.gov  Association of Diabetes Care and Education Specialists: www.diabeteseducator.org Summary  It is important to have healthy eating   habits because your blood sugar (glucose) levels are greatly affected by what you eat and drink.  A healthy meal plan will help you control your blood glucose and maintain a healthy lifestyle.  Your health care provider may recommend that you work with a dietitian to make a meal plan that is best for you.  Keep in mind that carbohydrates (carbs) and alcohol have immediate effects on your blood glucose levels. It is important to count carbs and to use alcohol carefully. This information is not intended to replace advice given to you by your health care provider. Make sure you discuss any questions you have with your health care provider. Document Revised: 04/12/2019 Document Reviewed: 04/12/2019 Elsevier Patient Education  2021 Elsevier Inc.  

## 2021-01-31 ENCOUNTER — Ambulatory Visit: Payer: BC Managed Care – PPO | Admitting: Family Medicine

## 2021-02-06 ENCOUNTER — Encounter: Payer: Self-pay | Admitting: Family Medicine

## 2021-02-06 ENCOUNTER — Ambulatory Visit: Payer: BC Managed Care – PPO | Admitting: Family Medicine

## 2021-02-06 ENCOUNTER — Other Ambulatory Visit: Payer: Self-pay

## 2021-02-06 VITALS — BP 124/76 | HR 100 | Temp 97.7°F | Wt 216.4 lb

## 2021-02-06 DIAGNOSIS — N529 Male erectile dysfunction, unspecified: Secondary | ICD-10-CM | POA: Diagnosis not present

## 2021-02-06 NOTE — Progress Notes (Signed)
   Subjective:    Patient ID: Fernando Vance, male    DOB: 1960/09/24, 60 y.o.   MRN: [id number]  HPI Pt here to discuss male related issue that has been going on for a few months.  Patient relates that he has a hard time maintaining erection.  Sometimes has a small amount of ejaculate come out and then he loses the erection Out of 10 separate relations 3 will be successful 7 will not be He states medicine seems to help some Patient does take his blood pressure medicine watch his diet closely  Review of Systems     Objective:   Physical Exam Lungs clear heart regular HEENT benign extremities no edema       Assessment & Plan:  I do not think that this is premature ejaculation he has never had a problem with that before.  It seems more so that he just cannot maintain erection to the point of being able to have relations Blood pressure good control Stop Cardura Hopefully this will help his erection issue Continue Viagra on a as needed basis Patient to give [place] update in 2 to 3 weeks If not having good success may consider urology consult for other approaches to help with erectile issues

## 2021-02-06 NOTE — Patient Instructions (Addendum)
stop doxazosin  We will see if that makes a difference

## 2021-03-20 ENCOUNTER — Ambulatory Visit (INDEPENDENT_AMBULATORY_CARE_PROVIDER_SITE_OTHER): Payer: BC Managed Care – PPO | Admitting: Family Medicine

## 2021-03-20 ENCOUNTER — Other Ambulatory Visit: Payer: Self-pay

## 2021-03-20 VITALS — BP 133/78 | HR 97 | Temp 97.9°F | Ht 68.0 in | Wt 218.0 lb

## 2021-03-20 DIAGNOSIS — I10 Essential (primary) hypertension: Secondary | ICD-10-CM

## 2021-03-20 DIAGNOSIS — Z0000 Encounter for general adult medical examination without abnormal findings: Secondary | ICD-10-CM

## 2021-03-20 DIAGNOSIS — N529 Male erectile dysfunction, unspecified: Secondary | ICD-10-CM

## 2021-03-20 DIAGNOSIS — E118 Type 2 diabetes mellitus with unspecified complications: Secondary | ICD-10-CM | POA: Diagnosis not present

## 2021-03-20 DIAGNOSIS — E1169 Type 2 diabetes mellitus with other specified complication: Secondary | ICD-10-CM

## 2021-03-20 DIAGNOSIS — Z23 Encounter for immunization: Secondary | ICD-10-CM

## 2021-03-20 DIAGNOSIS — Z125 Encounter for screening for malignant neoplasm of prostate: Secondary | ICD-10-CM | POA: Diagnosis not present

## 2021-03-20 DIAGNOSIS — Z0001 Encounter for general adult medical examination with abnormal findings: Secondary | ICD-10-CM

## 2021-03-20 DIAGNOSIS — E785 Hyperlipidemia, unspecified: Secondary | ICD-10-CM

## 2021-03-20 NOTE — Patient Instructions (Signed)

## 2021-03-20 NOTE — Progress Notes (Signed)
Reminder placed in reminder file  

## 2021-03-20 NOTE — Progress Notes (Signed)
   Subjective:    Patient ID: Fernando Vance, male    DOB: August 13, 1960, 60 y.o.   MRN: [id number]  HPI  The patient comes in today for a wellness visit.    A review of their health history was completed.  A review of medications was also completed.  Any needed refills; none  Eating habits: good  Falls/  MVA accidents in past few months: none  Regular exercise: walking . Staying active  Specialist pt sees on regular basis: none  Preventative health issues were discussed.   Additional concerns: none  Review of Systems     Objective:   Physical Exam General-in no acute distress Eyes-no discharge Lungs-respiratory rate normal, CTA CV-no murmurs,RRR Extremities skin warm dry no edema Neuro grossly normal Behavior normal, alert        Assessment & Plan:  This young patient was seen today for a wellness exam. Significant time was spent discussing the following items: -Developmental status for age was reviewed.  -Safety measures appropriate for age were discussed. -Review of immunizations was completed. The appropriate immunizations were discussed and ordered. -Dietary recommendations and physical activity recommendations were made. -Gen. health recommendations were reviewed -Discussion of growth parameters were also made with the family. -Questions regarding general health of the patient asked by the family were answered.  1. Immunization due Flu shot today pneumonia shot today - Flu Vaccine QUAD 41mo+IM (Fluarix, Fluzone & Alfiuria Quad PF) - Pneumococcal conjugate vaccine 20-valent (Prevnar 20)  2. Erectile dysfunction, unspecified erectile dysfunction type We have tried various measures he is still having times where he has what sounds like possibility of semen fluid along with erectile dysfunction issues hard to figure out recommend referral to urology - Ambulatory referral to Urology  3. Well adult exam Please see above Due for colonoscopy next  April  4. Hyperlipidemia associated with type 2 diabetes mellitus ([place]) Continue medication watch diet previous labs look good follow-up again within 6 months  5. HTN (hypertension), benign Blood pressure under good control continue current medication watch diet stay active

## 2021-03-21 LAB — COMPREHENSIVE METABOLIC PANEL
ALT: 23 IU/L (ref 0–44)
AST: 21 IU/L (ref 0–40)
Albumin/Globulin Ratio: 1.6 (ref 1.2–2.2)
Albumin: 4.9 g/dL (ref 3.8–4.9)
Alkaline Phosphatase: 92 IU/L (ref 44–121)
BUN/Creatinine Ratio: 16 (ref 9–20)
BUN: 20 mg/dL (ref 6–24)
Bilirubin Total: 0.7 mg/dL (ref 0.0–1.2)
CO2: 24 mmol/L (ref 20–29)
Calcium: 9.6 mg/dL (ref 8.7–10.2)
Chloride: 98 mmol/L (ref 96–106)
Creatinine, Ser: 1.23 mg/dL (ref 0.76–1.27)
Globulin, Total: 3.1 g/dL (ref 1.5–4.5)
Glucose: 135 mg/dL — ABNORMAL HIGH (ref 70–99)
Potassium: 4.4 mmol/L (ref 3.5–5.2)
Sodium: 140 mmol/L (ref 134–144)
Total Protein: 8 g/dL (ref 6.0–8.5)
eGFR: 68 mL/min/{1.73_m2} (ref >59)

## 2021-03-21 LAB — HEMOGLOBIN A1C
Est. average glucose Bld gHb Est-mCnc: 160 mg/dL
Hgb A1c MFr Bld: 7.2 % — ABNORMAL HIGH (ref 4.8–5.6)

## 2021-03-21 LAB — MICROALBUMIN / CREATININE URINE RATIO
Creatinine, Urine: 45.7 mg/dL
Microalb/Creat Ratio: 169 mg/g creat — ABNORMAL HIGH (ref 0–29)
Microalbumin, Urine: 77.4 ug/mL

## 2021-03-21 LAB — LIPID PANEL
Chol/HDL Ratio: 3.5 ratio (ref 0.0–5.0)
Cholesterol, Total: 207 mg/dL — ABNORMAL HIGH (ref 100–199)
HDL: 60 mg/dL (ref >39)
LDL Chol Calc (NIH): 124 mg/dL — ABNORMAL HIGH (ref 0–99)
Triglycerides: 128 mg/dL (ref 0–149)
VLDL Cholesterol Cal: 23 mg/dL (ref 5–40)

## 2021-03-21 LAB — PSA: Prostate Specific Ag, Serum: 0.6 ng/mL (ref 0.0–4.0)

## 2021-03-23 ENCOUNTER — Encounter: Payer: Self-pay | Admitting: Family Medicine

## 2021-03-27 MED ORDER — EZETIMIBE 10 MG PO TABS: 10.0000 mg | ORAL_TABLET | Freq: Every day | ORAL | 3 refills | Status: DC

## 2021-03-27 NOTE — Addendum Note (Signed)
Addended by: Dairl Ponder on: 03/27/2021 03:34 PM   Modules accepted: Orders

## 2021-05-21 ENCOUNTER — Ambulatory Visit: Payer: BLUE CROSS/BLUE SHIELD | Admitting: Urology

## 2021-05-21 ENCOUNTER — Other Ambulatory Visit: Payer: Self-pay

## 2021-05-21 ENCOUNTER — Encounter: Payer: Self-pay | Admitting: Urology

## 2021-05-21 VITALS — BP 170/82 | HR 114

## 2021-05-21 DIAGNOSIS — F524 Premature ejaculation: Secondary | ICD-10-CM

## 2021-05-21 DIAGNOSIS — N529 Male erectile dysfunction, unspecified: Secondary | ICD-10-CM | POA: Diagnosis not present

## 2021-05-21 LAB — URINALYSIS, ROUTINE W REFLEX MICROSCOPIC
Bilirubin, UA: NEGATIVE
Ketones, UA: NEGATIVE
Leukocytes,UA: NEGATIVE
Nitrite, UA: NEGATIVE
Protein,UA: NEGATIVE
Specific Gravity, UA: 1.01 (ref 1.005–1.030)
Urobilinogen, Ur: 0.2 mg/dL (ref 0.2–1.0)
pH, UA: 6 (ref 5.0–7.5)

## 2021-05-21 LAB — MICROSCOPIC EXAMINATION
Bacteria, UA: NONE SEEN
Epithelial Cells (non renal): NONE SEEN /hpf (ref 0–10)
RBC, Urine: NONE SEEN /hpf (ref 0–2)
Renal Epithel, UA: NONE SEEN /hpf
WBC, UA: NONE SEEN /hpf (ref 0–5)

## 2021-05-21 MED ORDER — PAROXETINE HCL 10 MG PO TABS: 10.0000 mg | ORAL_TABLET | Freq: Every day | ORAL | 11 refills | Status: DC

## 2021-05-21 NOTE — Addendum Note (Signed)
Addended by: Tyrone Apple on: 05/21/2021 02:48 PM   Modules accepted: Orders

## 2021-05-21 NOTE — Progress Notes (Signed)
05/21/2021 9:35 AM   Fernando Vance Jan 01, 1961 [id number]  Referring provider: Kathyrn Drown, MD [address] [place],  [place] [address]  Erectile dysfunction and premature ejaculation   HPI: Mr Fernando Vance is a 61yo here for evaluation and premature ejaculation. He noticed 2 months ago he was able to get an erection but then he could only maintain it for 1-2 minutes and then he ejaculates. Good libido. His erection is firm. He tried viagra 100mg  which failed to maintain his erection and prevent premature ejaculation. No issues with penile curvature. He has DMII and A1c is 7.4. No urinary complaints. No other complaints.    PMH: Past Medical History:  Diagnosis Date   Diabetes mellitus without complication ([place])    Hypertension     Surgical History: Past Surgical History:  Procedure Laterality Date   COLONOSCOPY N/A 08/18/2016   Procedure: COLONOSCOPY;  Surgeon: Danie Binder, MD;  Location: [facility];  Service: Endoscopy;  Laterality: N/A;  1030    POLYPECTOMY  08/18/2016   Procedure: POLYPECTOMY;  Surgeon: Danie Binder, MD;  Location: [facility];  Service: Endoscopy;;  sigmoid   WISDOM TOOTH EXTRACTION     20 years ago    Home Medications:  Allergies as of 05/21/2021   No Known Allergies      Medication List        Accurate as of May 21, 2021  9:35 AM. If you have any questions, ask your nurse or doctor.          amLODipine 10 MG tablet Commonly known as: NORVASC Take 1 tablet (10 mg total) by mouth daily.   empagliflozin 25 MG Tabs tablet Commonly known as: Jardiance Take 1 tablet (25 mg total) by mouth daily.   ezetimibe 10 MG tablet Commonly known as: Zetia Take 1 tablet (10 mg total) by mouth daily.   glipiZIDE 5 MG 24 hr tablet Commonly known as: GLUCOTROL XL 1 qd   metFORMIN 500 MG 24 hr tablet Commonly known as: GLUCOPHAGE-XR Take 1 tablet (500 mg total) by mouth 2 (two) times daily.   rosuvastatin 40 MG  tablet Commonly known as: CRESTOR Take 1 tablet (40 mg total) by mouth daily.   sildenafil 100 MG tablet Commonly known as: Viagra Take 0.5-1 tablets (50-100 mg total) by mouth daily as needed for erectile dysfunction.   valsartan-hydrochlorothiazide 80-12.5 MG tablet Commonly known as: Diovan HCT Take 1 tablet by mouth daily.        Allergies: No Known Allergies  Family History: Family History  Problem Relation Age of Onset   Diabetes Mother    Heart failure Father     Social History:  reports that he has never smoked. He has never used smokeless tobacco. He reports that he does not drink alcohol and does not use drugs.  ROS: All other review of systems were reviewed and are negative except what is noted above in HPI  Physical Exam: BP (!) 170/82    Pulse (!) 114   Constitutional:  Alert and oriented, No acute distress. HEENT: [place] AT, moist mucus membranes.  Trachea midline, no masses. Cardiovascular: No clubbing, cyanosis, or edema. Respiratory: Normal respiratory effort, no increased work of breathing. GI: Abdomen is soft, nontender, nondistended, no abdominal masses GU: No CVA tenderness.  Lymph: No cervical or inguinal lymphadenopathy. Skin: No rashes, bruises or suspicious lesions. Neurologic: Grossly intact, no focal deficits, moving all 4 extremities. Psychiatric: Normal mood and affect.  Laboratory Data:  Lab Results  Component Value Date   WBC 5.3 08/16/2019   HGB 14.1 08/16/2019   HCT 45.6 08/16/2019   MCV 73 (L) 08/16/2019   PLT 342 08/16/2019    Lab Results  Component Value Date   CREATININE 1.23 03/20/2021    No results found for: PSA  No results found for: TESTOSTERONE  Lab Results  Component Value Date   HGBA1C 7.2 (H) 03/20/2021    Urinalysis No results found for: COLORURINE, APPEARANCEUR, LABSPEC, PHURINE, GLUCOSEU, HGBUR, BILIRUBINUR, KETONESUR, PROTEINUR, UROBILINOGEN, NITRITE, LEUKOCYTESUR  Lab Results  Component Value Date    LABMICR 77.4 03/20/2021    Pertinent Imaging:  No results found for this or any previous visit.  No results found for this or any previous visit.  No results found for this or any previous visit.  No results found for this or any previous visit.  No results found for this or any previous visit.  No results found for this or any previous visit.  No results found for this or any previous visit.  No results found for this or any previous visit.   Assessment & Plan:    1. Premature Ejaculation We discussed the various treatment options including behavorial therapy, lidocaine creams, and SSRIs. After discussing the options the patient elects to trial daily paxil 10mg . RTC 6 weeks.    No follow-ups on file.  Nicolette Bang, MD  [facility] [place]

## 2021-05-21 NOTE — Progress Notes (Signed)

## 2021-05-21 NOTE — Patient Instructions (Signed)
Erectile Dysfunction °Erectile dysfunction (ED) is the inability to get or keep an erection in order to have sexual intercourse. ED is considered a symptom of an underlying disorder and is not considered a disease. ED may include: °Inability to get an erection. °Lack of enough hardness of the erection to allow penetration. °Loss of erection before sex is finished. °What are the causes? °This condition may be caused by: °Physical causes, such as: °Artery problems. This may include heart disease, high blood pressure, atherosclerosis, and diabetes. °Hormonal problems, such as low testosterone. °Obesity. °Nerve problems. This may include back or pelvic injuries, multiple sclerosis, Parkinson's disease, spinal cord injury, and stroke. °Certain medicines, such as: °Pain relievers. °Antidepressants. °Blood pressure medicines and water pills (diuretics). °Cancer medicines. °Antihistamines. °Muscle relaxants. °Lifestyle factors, such as: °Use of drugs such as marijuana, cocaine, or opioids. °Excessive use of alcohol. °Smoking. °Lack of physical activity or exercise. °Psychological causes, such as: °Anxiety or stress. °Sadness or depression. °Exhaustion. °Fear about sexual performance. °Guilt. °What are the signs or symptoms? °Symptoms of this condition include: °Inability to get an erection. °Lack of enough hardness of the erection to allow penetration. °Loss of the erection before sex is finished. °Sometimes having normal erections, but with frequent unsatisfactory episodes. °Low sexual satisfaction in either partner due to erection problems. °A curved penis occurring with erection. The curve may cause pain, or the penis may be too curved to allow for intercourse. °Never having nighttime or morning erections. °How is this diagnosed? °This condition is often diagnosed by: °Performing a physical exam to find other diseases or specific problems with the penis. °Asking you detailed questions about the problem. °Doing tests,  such as: °Blood tests to check for diabetes mellitus or high cholesterol, or to measure hormone levels. °Other tests to check for underlying health conditions. °An ultrasound exam to check for scarring. °A test to check blood flow to the penis. °Doing a sleep study at home to measure nighttime erections. °How is this treated? °This condition may be treated by: °Medicines, such as: °Medicine taken by mouth to help you achieve an erection (oral medicine). °Hormone replacement therapy to replace low testosterone levels. °Medicine that is injected into the penis. Your health care provider may instruct you how to give yourself these injections at home. °Medicine that is delivered with a short applicator tube. The tube is inserted into the opening at the tip of the penis, which is the opening of the urethra. A tiny pellet of medicine is put in the urethra. The pellet dissolves and enhances erectile function. This is also called MUSE (medicated urethral system for erections) therapy. °Vacuum pump. This is a pump with a ring on it. The pump and ring are placed on the penis and used to create pressure that helps the penis become erect. °Penile implant surgery. In this procedure, you may receive: °An inflatable implant. This consists of cylinders, a pump, and a reservoir. The cylinders can be inflated with a fluid that helps to create an erection, and they can be deflated after intercourse. °A semi-rigid implant. This consists of two silicone rubber rods. The rods provide some rigidity. They are also flexible, so the penis can both curve downward in its normal position and become straight for sexual intercourse. °Blood vessel surgery to improve blood flow to the penis. During this procedure, a blood vessel from a different part of the body is placed into the penis to allow blood to flow around (bypass) damaged or blocked blood vessels. °Lifestyle changes,   such as exercising more, losing weight, and quitting smoking. °Follow  these instructions at home: °Medicines ° °Take over-the-counter and prescription medicines only as told by your health care provider. Do not increase the dosage without first discussing it with your health care provider. °If you are using self-injections, do injections as directed by your health care provider. Make sure you avoid any veins that are on the surface of the penis. After giving an injection, apply pressure to the injection site for 5 minutes. °Talk to your health care provider about how to prevent headaches while taking ED medicines. These medicines may cause a sudden headache due to the increase in blood flow in your body. °General instructions °Exercise regularly, as directed by your health care provider. Work with your health care provider to lose weight, if needed. °Do not use any products that contain nicotine or tobacco. These products include cigarettes, chewing tobacco, and vaping devices, such as e-cigarettes. If you need help quitting, ask your health care provider. °Before using a vacuum pump, read the instructions that come with the pump and discuss any questions with your health care provider. °Keep all follow-up visits. This is important. °Contact a health care provider if: °You feel nauseous. °You are vomiting. °You get sudden headaches while taking ED medicines. °You have any concerns about your sexual health. °Get help right away if: °You are taking oral or injectable medicines and you have an erection that lasts longer than 4 hours. If your health care provider is unavailable, go to the nearest emergency room for evaluation. An erection that lasts much longer than 4 hours can result in permanent damage to your penis. °You have severe pain in your groin or abdomen. °You develop redness or severe swelling of your penis. °You have redness spreading at your groin or lower abdomen. °You are unable to urinate. °You experience chest pain or a rapid heartbeat (palpitations) after taking oral  medicines. °These symptoms may represent a serious problem that is an emergency. Do not wait to see if the symptoms will go away. Get medical help right away. Call your local emergency services (911 in the [place] Do not drive yourself to the hospital. °Summary °Erectile dysfunction (ED) is the inability to get or keep an erection during sexual intercourse. °This condition is diagnosed based on a physical exam, your symptoms, and tests to determine the cause. Treatment varies depending on the cause and may include medicines, hormone therapy, surgery, or a vacuum pump. °You may need follow-up visits to make sure that you are using your medicines or devices correctly. °Get help right away if you are taking or injecting medicines and you have an erection that lasts longer than 4 hours. °This information is not intended to replace advice given to you by your health care provider. Make sure you discuss any questions you have with your health care provider. °Document Revised: 08/01/2020 Document Reviewed: 08/01/2020 °Elsevier Patient Education © 2022 [employer] Inc[employer] ° °

## 2021-05-25 ENCOUNTER — Other Ambulatory Visit: Payer: Self-pay | Admitting: Family Medicine

## 2021-06-13 DIAGNOSIS — E119 Type 2 diabetes mellitus without complications: Secondary | ICD-10-CM | POA: Diagnosis not present

## 2021-06-13 DIAGNOSIS — Z7984 Long term (current) use of oral hypoglycemic drugs: Secondary | ICD-10-CM | POA: Diagnosis not present

## 2021-06-13 DIAGNOSIS — H524 Presbyopia: Secondary | ICD-10-CM | POA: Diagnosis not present

## 2021-07-02 ENCOUNTER — Ambulatory Visit: Payer: BC Managed Care – PPO | Admitting: Urology

## 2021-07-02 ENCOUNTER — Other Ambulatory Visit: Payer: Self-pay

## 2021-07-02 VITALS — BP 159/84 | HR 97

## 2021-07-02 DIAGNOSIS — F524 Premature ejaculation: Secondary | ICD-10-CM | POA: Diagnosis not present

## 2021-07-02 DIAGNOSIS — N529 Male erectile dysfunction, unspecified: Secondary | ICD-10-CM | POA: Diagnosis not present

## 2021-07-02 MED ORDER — TADALAFIL 20 MG PO TABS: 20.0000 mg | ORAL_TABLET | Freq: Every day | ORAL | 5 refills | Status: DC | PRN

## 2021-07-02 NOTE — Progress Notes (Signed)
07/02/2021 10:16 AM   Fernando Vance [birth year] [id number]  Referring provider: Kathyrn Drown, MD [address],  [place] [address]  Followup erectile dysfunction and premature ejaculation  HPI: Fernando Vance is a 61yo here for followup for erectile dysfunction and premature ejaculation. He was given paxil 10mg  daily last visit which resolved his premature ejaculation. He has issues getting a firm erection. His erections now are semifirm and he has difficulty maintaining it.    PMH: Past Medical History:  Diagnosis Date   Diabetes mellitus without complication ([place])    Hypertension     Surgical History: Past Surgical History:  Procedure Laterality Date   COLONOSCOPY N/A 08/18/2016   Procedure: COLONOSCOPY;  Surgeon: Danie Binder, MD;  [facility];  Service: Endoscopy;  Laterality: N/A;  1030    POLYPECTOMY  08/18/2016   Procedure: POLYPECTOMY;  Surgeon: Danie Binder, MD;  [facility];  Service: Endoscopy;;  sigmoid   WISDOM TOOTH EXTRACTION     20 years ago    Home Medications:  Allergies as of 07/02/2021   No Known Allergies      Medication List        Accurate as of July 02, 2021 10:16 AM. If you have any questions, ask your nurse or doctor.          amLODipine 10 MG tablet Commonly known as: NORVASC Take 1 tablet (10 mg total) by mouth daily.   empagliflozin 25 MG Tabs tablet Commonly known as: Jardiance Take 1 tablet (25 mg total) by mouth daily.   ezetimibe 10 MG tablet Commonly known as: Zetia Take 1 tablet (10 mg total) by mouth daily.   glipiZIDE 5 MG 24 hr tablet Commonly known as: GLUCOTROL XL 1 qd   metFORMIN 500 MG 24 hr tablet Commonly known as: GLUCOPHAGE-XR Take 1 tablet (500 mg total) by mouth 2 (two) times daily.   PARoxetine 10 MG tablet Commonly known as: Paxil Take 1 tablet (10 mg total) by mouth daily.   rosuvastatin 40 MG tablet Commonly known as: CRESTOR Take 1 tablet  (40 mg total) by mouth daily.   sildenafil 100 MG tablet Commonly known as: Viagra Take 0.5-1 tablets (50-100 mg total) by mouth daily as needed for erectile dysfunction.   valsartan-hydrochlorothiazide 80-12.5 MG tablet Commonly known as: DIOVAN-HCT TAKE 1 TABLET BY MOUTH ONCE A DAY.        Allergies: No Known Allergies  Family History: Family History  Problem Relation Age of Onset   Diabetes Mother    Heart failure Father     Social History:  reports that he has never smoked. He has never used smokeless tobacco. He reports that he does not drink alcohol and does not use drugs.  ROS: All other review of systems were reviewed and are negative except what is noted above in HPI  Physical Exam: There were no vitals taken for this visit.  Constitutional:  Alert and oriented, No acute distress. HEENT: [place] AT, moist mucus membranes.  Trachea midline, no masses. Cardiovascular: No clubbing, cyanosis, or edema. Respiratory: Normal respiratory effort, no increased work of breathing. GI: Abdomen is soft, nontender, nondistended, no abdominal masses GU: No CVA tenderness.  Lymph: No cervical or inguinal lymphadenopathy. Skin: No rashes, bruises or suspicious lesions. Neurologic: Grossly intact, no focal deficits, moving all 4 extremities. Psychiatric: Normal mood and affect.  Laboratory Data: Lab Results  Component Value Date   WBC 5.3 08/16/2019  HGB 14.1 08/16/2019   HCT 45.6 08/16/2019   MCV 73 (L) 08/16/2019   PLT 342 08/16/2019    Lab Results  Component Value Date   CREATININE 1.23 03/20/2021    No results found for: PSA  No results found for: TESTOSTERONE  Lab Results  Component Value Date   HGBA1C 7.2 (H) 03/20/2021    Urinalysis    Component Value Date/Time   APPEARANCEUR Clear 05/21/2021 1618   GLUCOSEU 3+ (A) 05/21/2021 1618   BILIRUBINUR Negative 05/21/2021 1618   PROTEINUR Negative 05/21/2021 1618   NITRITE Negative 05/21/2021 1618    LEUKOCYTESUR Negative 05/21/2021 1618    Lab Results  Component Value Date   LABMICR See below: 05/21/2021   WBCUA None seen 05/21/2021   LABEPIT None seen 05/21/2021   BACTERIA None seen 05/21/2021    Pertinent Imaging:  No results found for this or any previous visit.  No results found for this or any previous visit.  No results found for this or any previous visit.  No results found for this or any previous visit.  No results found for this or any previous visit.  No results found for this or any previous visit.  No results found for this or any previous visit.  No results found for this or any previous visit.   Assessment & Plan:    1. Premature ejaculation -Continue paxil 10mg  daily  2. Erectile dysfunction, unspecified erectile dysfunction type We will trial tadalafil 20mg  prn   No follow-ups on file.  Nicolette Bang, MD  [facility] [place]

## 2021-07-03 LAB — URINALYSIS, ROUTINE W REFLEX MICROSCOPIC
Bilirubin, UA: NEGATIVE
Ketones, UA: NEGATIVE
Leukocytes,UA: NEGATIVE
Nitrite, UA: NEGATIVE
Specific Gravity, UA: 1.01 (ref 1.005–1.030)
Urobilinogen, Ur: 0.2 mg/dL (ref 0.2–1.0)
pH, UA: 5.5 (ref 5.0–7.5)

## 2021-07-03 LAB — MICROSCOPIC EXAMINATION
Epithelial Cells (non renal): NONE SEEN /hpf (ref 0–10)
Renal Epithel, UA: NONE SEEN /hpf
WBC, UA: NONE SEEN /hpf (ref 0–5)

## 2021-07-09 ENCOUNTER — Encounter: Payer: Self-pay | Admitting: Urology

## 2021-07-09 NOTE — Patient Instructions (Signed)
Erectile Dysfunction °Erectile dysfunction (ED) is the inability to get or keep an erection in order to have sexual intercourse. ED is considered a symptom of an underlying disorder and is not considered a disease. ED may include: °Inability to get an erection. °Lack of enough hardness of the erection to allow penetration. °Loss of erection before sex is finished. °What are the causes? °This condition may be caused by: °Physical causes, such as: °Artery problems. This may include heart disease, high blood pressure, atherosclerosis, and diabetes. °Hormonal problems, such as low testosterone. °Obesity. °Nerve problems. This may include back or pelvic injuries, multiple sclerosis, Parkinson's disease, spinal cord injury, and stroke. °Certain medicines, such as: °Pain relievers. °Antidepressants. °Blood pressure medicines and water pills (diuretics). °Cancer medicines. °Antihistamines. °Muscle relaxants. °Lifestyle factors, such as: °Use of drugs such as marijuana, cocaine, or opioids. °Excessive use of alcohol. °Smoking. °Lack of physical activity or exercise. °Psychological causes, such as: °Anxiety or stress. °Sadness or depression. °Exhaustion. °Fear about sexual performance. °Guilt. °What are the signs or symptoms? °Symptoms of this condition include: °Inability to get an erection. °Lack of enough hardness of the erection to allow penetration. °Loss of the erection before sex is finished. °Sometimes having normal erections, but with frequent unsatisfactory episodes. °Low sexual satisfaction in either partner due to erection problems. °A curved penis occurring with erection. The curve may cause pain, or the penis may be too curved to allow for intercourse. °Never having nighttime or morning erections. °How is this diagnosed? °This condition is often diagnosed by: °Performing a physical exam to find other diseases or specific problems with the penis. °Asking you detailed questions about the problem. °Doing tests,  such as: °Blood tests to check for diabetes mellitus or high cholesterol, or to measure hormone levels. °Other tests to check for underlying health conditions. °An ultrasound exam to check for scarring. °A test to check blood flow to the penis. °Doing a sleep study at home to measure nighttime erections. °How is this treated? °This condition may be treated by: °Medicines, such as: °Medicine taken by mouth to help you achieve an erection (oral medicine). °Hormone replacement therapy to replace low testosterone levels. °Medicine that is injected into the penis. Your health care provider may instruct you how to give yourself these injections at home. °Medicine that is delivered with a short applicator tube. The tube is inserted into the opening at the tip of the penis, which is the opening of the urethra. A tiny pellet of medicine is put in the urethra. The pellet dissolves and enhances erectile function. This is also called MUSE (medicated urethral system for erections) therapy. °Vacuum pump. This is a pump with a ring on it. The pump and ring are placed on the penis and used to create pressure that helps the penis become erect. °Penile implant surgery. In this procedure, you may receive: °An inflatable implant. This consists of cylinders, a pump, and a reservoir. The cylinders can be inflated with a fluid that helps to create an erection, and they can be deflated after intercourse. °A semi-rigid implant. This consists of two silicone rubber rods. The rods provide some rigidity. They are also flexible, so the penis can both curve downward in its normal position and become straight for sexual intercourse. °Blood vessel surgery to improve blood flow to the penis. During this procedure, a blood vessel from a different part of the body is placed into the penis to allow blood to flow around (bypass) damaged or blocked blood vessels. °Lifestyle changes,   such as exercising more, losing weight, and quitting smoking. °Follow  these instructions at home: °Medicines ° °Take over-the-counter and prescription medicines only as told by your health care provider. Do not increase the dosage without first discussing it with your health care provider. °If you are using self-injections, do injections as directed by your health care provider. Make sure you avoid any veins that are on the surface of the penis. After giving an injection, apply pressure to the injection site for 5 minutes. °Talk to your health care provider about how to prevent headaches while taking ED medicines. These medicines may cause a sudden headache due to the increase in blood flow in your body. °General instructions °Exercise regularly, as directed by your health care provider. Work with your health care provider to lose weight, if needed. °Do not use any products that contain nicotine or tobacco. These products include cigarettes, chewing tobacco, and vaping devices, such as e-cigarettes. If you need help quitting, ask your health care provider. °Before using a vacuum pump, read the instructions that come with the pump and discuss any questions with your health care provider. °Keep all follow-up visits. This is important. °Contact a health care provider if: °You feel nauseous. °You are vomiting. °You get sudden headaches while taking ED medicines. °You have any concerns about your sexual health. °Get help right away if: °You are taking oral or injectable medicines and you have an erection that lasts longer than 4 hours. If your health care provider is unavailable, go to the nearest emergency room for evaluation. An erection that lasts much longer than 4 hours can result in permanent damage to your penis. °You have severe pain in your groin or abdomen. °You develop redness or severe swelling of your penis. °You have redness spreading at your groin or lower abdomen. °You are unable to urinate. °You experience chest pain or a rapid heartbeat (palpitations) after taking oral  medicines. °These symptoms may represent a serious problem that is an emergency. Do not wait to see if the symptoms will go away. Get medical help right away. Call your local emergency services (911 in the [place] Do not drive yourself to the hospital. °Summary °Erectile dysfunction (ED) is the inability to get or keep an erection during sexual intercourse. °This condition is diagnosed based on a physical exam, your symptoms, and tests to determine the cause. Treatment varies depending on the cause and may include medicines, hormone therapy, surgery, or a vacuum pump. °You may need follow-up visits to make sure that you are using your medicines or devices correctly. °Get help right away if you are taking or injecting medicines and you have an erection that lasts longer than 4 hours. °This information is not intended to replace advice given to you by your health care provider. Make sure you discuss any questions you have with your health care provider. °Document Revised: 08/01/2020 Document Reviewed: 08/01/2020 °Elsevier Patient Education © 2022 [employer] Inc[employer] ° °

## 2021-07-11 ENCOUNTER — Other Ambulatory Visit: Payer: Self-pay | Admitting: *Deleted

## 2021-07-11 DIAGNOSIS — Z1211 Encounter for screening for malignant neoplasm of colon: Secondary | ICD-10-CM

## 2021-07-22 ENCOUNTER — Encounter: Payer: Self-pay | Admitting: Internal Medicine

## 2021-08-13 ENCOUNTER — Other Ambulatory Visit: Payer: Self-pay | Admitting: Family Medicine

## 2021-08-14 ENCOUNTER — Ambulatory Visit: Payer: BC Managed Care – PPO | Admitting: Family Medicine

## 2021-08-14 ENCOUNTER — Encounter: Payer: Self-pay | Admitting: Family Medicine

## 2021-08-14 ENCOUNTER — Ambulatory Visit (HOSPITAL_COMMUNITY)
Admission: RE | Admit: 2021-08-14 | Discharge: 2021-08-14 | Disposition: A | Discharge status: Home / Self Care | Payer: BC Managed Care – PPO | Source: Ambulatory Visit | Attending: Family Medicine | Admitting: Family Medicine

## 2021-08-14 ENCOUNTER — Other Ambulatory Visit: Payer: Self-pay

## 2021-08-14 VITALS — BP 158/62 | HR 97 | Temp 98.3°F | Wt 220.4 lb

## 2021-08-14 DIAGNOSIS — M25532 Pain in left wrist: Secondary | ICD-10-CM | POA: Diagnosis not present

## 2021-08-14 DIAGNOSIS — M7989 Other specified soft tissue disorders: Secondary | ICD-10-CM | POA: Diagnosis not present

## 2021-08-14 MED ORDER — MELOXICAM 15 MG PO TABS: 15.0000 mg | ORAL_TABLET | Freq: Every day | ORAL | 0 refills | Status: DC

## 2021-08-14 NOTE — Assessment & Plan Note (Signed)
X-ray was obtained today and reviewed by me.  Interpretation: No fracture, effusion, or degenerative changes.  Normal x-ray.  Advised rest, ice, elevation.  Meloxicam as directed.  Work note given.  Supportive care. ?

## 2021-08-14 NOTE — Patient Instructions (Signed)
Rest, ice, elevation. ? ?Xray today. ? ?Medication as directed (daily for the next few days to 1 week). ? ?Take care ? ?Dr. Lacinda Axon  ?

## 2021-08-14 NOTE — Progress Notes (Signed)
? ?Subjective:  ?Patient ID: Fernando Vance, male    DOB: 11/04/1960  Age: 61 y.o. MRN: [id number] ? ?CC: ?Chief Complaint  ?Patient presents with  ? Wrist Pain  ?  Left wrist pain; sore, tender and swollen. Pt is unsure of injury. Pt states he did fall asleep with left hand on head on Sunday; Monday became worse. Pt works with hands for 12 hours at [employer]  ? ? ?HPI: ? ?61 year old male presents for evaluation of the above. ? ?Patient reports that this started on Sunday.  Started after work.  He has been experiencing left wrist pain and associated swelling.  Pain with range of motion.  He is left-handed.  No reports of erythema or warmth.  He denies any fall, trauma, injury.  He does note that he works a physical job at [employer] and has to use his hands extensively.  The pain is predominantly on the ulnar aspect of the wrist.  Pain is moderate in severity. ? ?Patient Active Problem List  ? Diagnosis Date Noted  ? Left wrist pain 08/14/2021  ? Tubular adenoma 03/16/2017  ? Aortic valve calcification 09/10/2016  ? Special screening for malignant neoplasms, colon   ? HTN (hypertension), benign 08/05/2016  ? Hyperlipidemia associated with type 2 diabetes mellitus ([place]) 07/08/2016  ? Controlled diabetes mellitus type 2 with complications ([place]) 24/58/0998  ? Anemia 07/08/2016  ? ? ?Social Hx   ?Social History  ? ?Socioeconomic History  ? Marital status: Married  ?  Spouse name: Not on file  ? Number of children: Not on file  ? Years of education: Not on file  ? Highest education level: Not on file  ?Occupational History  ? Not on file  ?Tobacco Use  ? Smoking status: Never  ? Smokeless tobacco: Never  ?Vaping Use  ? Vaping Use: Never used  ?Substance and Sexual Activity  ? Alcohol use: No  ? Drug use: No  ? Sexual activity: Not on file  ?Other Topics Concern  ? Not on file  ?Social History Narrative  ? Not on file  ? ?Social Determinants of Health  ? ?Financial Resource Strain: Not on file  ?Food Insecurity:  Not on file  ?Transportation Needs: Not on file  ?Physical Activity: Not on file  ?Stress: Not on file  ?Social Connections: Not on file  ? ? ?Review of Systems ?Per HPI ? ?Objective:  ?BP (!) 158/62   Pulse 97   Temp 98.3 ?F (36.8 ?C)   Wt 220 lb 6.4 oz (100 kg)   SpO2 100%   BMI 33.51 kg/m?  ? ? ?  08/14/2021  ? 10:31 AM 07/02/2021  ? 10:19 AM 05/21/2021  ?  9:24 AM  ?BP/Weight  ?Systolic BP 338 250 539  ?Diastolic BP 62 84 82  ?Wt. (Lbs) 220.4    ?BMI 33.51 kg/m2    ? ? ?Physical Exam ?Constitutional:   ?   General: He is not in acute distress. ?   Appearance: Normal appearance.  ?HENT:  ?   Head: Normocephalic and atraumatic.  ?Eyes:  ?   General:     ?   Right eye: No discharge.     ?   Left eye: No discharge.  ?   Conjunctiva/sclera: Conjunctivae normal.  ?Pulmonary:  ?   Effort: Pulmonary effort is normal. No respiratory distress.  ?Musculoskeletal:  ?   Comments: Left wrist -pain with range of motion.  Diffuse swelling noted.  Tenderness over the middle of  the wrist as well as the ulnar aspect.  No warmth or erythema.  ?Neurological:  ?   Mental Status: He is alert.  ?Psychiatric:     ?   Mood and Affect: Mood normal.     ?   Behavior: Behavior normal.  ? ? ?Lab Results  ?Component Value Date  ? WBC 5.3 08/16/2019  ? HGB 14.1 08/16/2019  ? HCT 45.6 08/16/2019  ? PLT 342 08/16/2019  ? GLUCOSE 135 (H) 03/20/2021  ? CHOL 207 (H) 03/20/2021  ? TRIG 128 03/20/2021  ? HDL 60 03/20/2021  ? LDLCALC 124 (H) 03/20/2021  ? ALT 23 03/20/2021  ? AST 21 03/20/2021  ? NA 140 03/20/2021  ? K 4.4 03/20/2021  ? CL 98 03/20/2021  ? CREATININE 1.23 03/20/2021  ? BUN 20 03/20/2021  ? CO2 24 03/20/2021  ? HGBA1C 7.2 (H) 03/20/2021  ? ? ? ?Assessment & Plan:  ? ?Problem List Items Addressed This Visit   ? ?  ? Other  ? Left wrist pain - Primary  ?  X-ray was obtained today and reviewed by me.  Interpretation: No fracture, effusion, or degenerative changes.  Normal x-ray.  Advised rest, ice, elevation.  Meloxicam as directed.   Work note given.  Supportive care. ?  ?  ? Relevant Orders  ? DG Wrist Complete Left (Completed)  ? ? ?Meds ordered this encounter  ?Medications  ? meloxicam (MOBIC) 15 MG tablet  ?  Sig: Take 1 tablet (15 mg total) by mouth daily.  ?  Dispense:  14 tablet  ?  Refill:  0  ? ?Thersa Salt DO ?[place] ? ?

## 2021-08-16 ENCOUNTER — Ambulatory Visit: Payer: BC Managed Care – PPO | Admitting: Urology

## 2021-09-04 ENCOUNTER — Encounter: Payer: Self-pay | Admitting: Urology

## 2021-09-04 ENCOUNTER — Ambulatory Visit: Payer: BC Managed Care – PPO | Admitting: Urology

## 2021-09-04 VITALS — BP 155/82 | HR 94

## 2021-09-04 DIAGNOSIS — N529 Male erectile dysfunction, unspecified: Secondary | ICD-10-CM

## 2021-09-04 DIAGNOSIS — F524 Premature ejaculation: Secondary | ICD-10-CM

## 2021-09-04 MED ORDER — SILDENAFIL CITRATE 100 MG PO TABS: 100.0000 mg | ORAL_TABLET | Freq: Every day | ORAL | 6 refills | Status: DC | PRN

## 2021-09-04 NOTE — Progress Notes (Signed)
? ?09/04/2021 ?11:18 AM  ? ?Fernando Vance ?[birth year] ?[id number] ? ?Referring provider: Kathyrn Drown, MD ?[address] ?[address] ?[place],  [place] [address] ? ?Followup erectile dysfunction ? ? ?HPI: ?Fernando Vance is a 61yo here for followup for erectile dysfunction and premature ejaculation. He has no issues with premature ejaculation ejaculation if he takes the paxil daily. He notes mild improvement in the firmness of his erections with tadalafil '20mg'$  prn. Good libido. No other complaints.  ? ? ?PMH: ?Past Medical History:  ?Diagnosis Date  ? Diabetes mellitus without complication ([place])   ? Hypertension   ? ? ?Surgical History: ?Past Surgical History:  ?Procedure Laterality Date  ? COLONOSCOPY N/A 08/18/2016  ? Procedure: COLONOSCOPY;  Surgeon: Fernando Binder, MD;  [facility];  Service: Endoscopy;  Laterality: N/A;  1030 ?  ? POLYPECTOMY  08/18/2016  ? Procedure: POLYPECTOMY;  Surgeon: Fernando Binder, MD;  [facility];  Service: Endoscopy;;  sigmoid  ? WISDOM TOOTH EXTRACTION    ? 20 years ago  ? ? ?Home Medications:  ?Allergies as of 09/04/2021   ?No Known Allergies ?  ? ?  ?Medication List  ?  ? ?  ? Accurate as of September 04, 2021 11:18 AM. If you have any questions, ask your nurse or doctor.  ?  ?  ? ?  ? ?amLODipine 10 MG tablet ?Commonly known as: NORVASC ?TAKE ONE TABLET BY MOUTH ONCE DAILY. ?  ?ezetimibe 10 MG tablet ?Commonly known as: Zetia ?Take 1 tablet (10 mg total) by mouth daily. ?  ?glipiZIDE 5 MG 24 hr tablet ?Commonly known as: GLUCOTROL XL ?TAKE 1 TABLET BY MOUTH ONCE DAILY WITH BREAKFAST. ?  ?Jardiance 25 MG Tabs tablet ?Generic drug: empagliflozin ?TAKE ONE TABLET BY MOUTH ONCE DAILY. ?  ?meloxicam 15 MG tablet ?Commonly known as: MOBIC ?Take 1 tablet (15 mg total) by mouth daily. ?  ?metFORMIN 500 MG 24 hr tablet ?Commonly known as: GLUCOPHAGE-XR ?TAKE 1 TABLET BY MOUTH TWICE DAILY. ?  ?PARoxetine 10 MG tablet ?Commonly known as: Paxil ?Take 1 tablet (10 mg total) by  mouth daily. ?  ?rosuvastatin 40 MG tablet ?Commonly known as: CRESTOR ?TAKE ONE TABLET BY MOUTH ONCE DAILY. ?  ?sildenafil 100 MG tablet ?Commonly known as: Viagra ?Take 0.5-1 tablets (50-100 mg total) by mouth daily as needed for erectile dysfunction. ?  ?tadalafil 20 MG tablet ?Commonly known as: CIALIS ?Take 1 tablet (20 mg total) by mouth daily as needed for erectile dysfunction. ?  ?valsartan-hydrochlorothiazide 80-12.5 MG tablet ?Commonly known as: DIOVAN-HCT ?TAKE 1 TABLET BY MOUTH ONCE A DAY. ?  ? ?  ? ? ?Allergies: No Known Allergies ? ?Family History: ?Family History  ?Problem Relation Age of Onset  ? Diabetes Mother   ? Heart failure Father   ? ? ?Social History:  reports that he has never smoked. He has never used smokeless tobacco. He reports that he does not drink alcohol and does not use drugs. ? ?ROS: ?All other review of systems were reviewed and are negative except what is noted above in HPI ? ?Physical Exam: ?BP (!) 155/82   Pulse 94   ?Constitutional:  Alert and oriented, No acute distress. ?HEENT: [place] AT, moist mucus membranes.  Trachea midline, no masses. ?Cardiovascular: No clubbing, cyanosis, or edema. ?Respiratory: Normal respiratory effort, no increased work of breathing. ?GI: Abdomen is soft, nontender, nondistended, no abdominal masses ?GU: No CVA tenderness.  ?Lymph: No cervical or inguinal lymphadenopathy. ?  Skin: No rashes, bruises or suspicious lesions. ?Neurologic: Grossly intact, no focal deficits, moving all 4 extremities. ?Psychiatric: Normal mood and affect. ? ?Laboratory Data: ?Lab Results  ?Component Value Date  ? WBC 5.3 08/16/2019  ? HGB 14.1 08/16/2019  ? HCT 45.6 08/16/2019  ? MCV 73 (L) 08/16/2019  ? PLT 342 08/16/2019  ? ? ?Lab Results  ?Component Value Date  ? CREATININE 1.23 03/20/2021  ? ? ?No results found for: PSA ? ?No results found for: TESTOSTERONE ? ?Lab Results  ?Component Value Date  ? HGBA1C 7.2 (H) 03/20/2021  ? ? ?Urinalysis ?   ?Component Value Date/Time  ?  APPEARANCEUR Clear 07/02/2021 1433  ? GLUCOSEU 3+ (A) 07/02/2021 1433  ? BILIRUBINUR Negative 07/02/2021 1433  ? PROTEINUR 1+ (A) 07/02/2021 1433  ? NITRITE Negative 07/02/2021 1433  ? LEUKOCYTESUR Negative 07/02/2021 1433  ? ? ?Lab Results  ?Component Value Date  ? LABMICR See below: 07/02/2021  ? [place] None seen 07/02/2021  ? LABEPIT None seen 07/02/2021  ? MUCUS Present 07/02/2021  ? BACTERIA Few 07/02/2021  ? ? ?Pertinent Imaging: ? ?No results found for this or any previous visit. ? ?No results found for this or any previous visit. ? ?No results found for this or any previous visit. ? ?No results found for this or any previous visit. ? ?No results found for this or any previous visit. ? ?No results found for this or any previous visit. ? ?No results found for this or any previous visit. ? ?No results found for this or any previous visit. ? ? ?Assessment & Plan:   ? ?1. Premature ejaculation ?-Continue paxil '10mg'$  daily ? ?2. Erectile dysfunction, unspecified erectile dysfunction type ?-Switch to sildenafil '100mg'$  prn ? ? ?No follow-ups on file. ? ?Fernando Bang, MD ? ?[facility] [place] ?  ?

## 2021-09-04 NOTE — Patient Instructions (Signed)
Erectile Dysfunction ?Erectile dysfunction (ED) is the inability to get or keep an erection in order to have sexual intercourse. ED is considered a symptom of an underlying disorder and is not considered a disease. ED may include: ?Inability to get an erection. ?Lack of enough hardness of the erection to allow penetration. ?Loss of erection before sex is finished. ?What are the causes? ?This condition may be caused by: ?Physical causes, such as: ?Artery problems. This may include heart disease, high blood pressure, atherosclerosis, and diabetes. ?Hormonal problems, such as low testosterone. ?Obesity. ?Nerve problems. This may include back or pelvic injuries, multiple sclerosis, Parkinson's disease, spinal cord injury, and stroke. ?Certain medicines, such as: ?Pain relievers. ?Antidepressants. ?Blood pressure medicines and water pills (diuretics). ?Cancer medicines. ?Antihistamines. ?Muscle relaxants. ?Lifestyle factors, such as: ?Use of drugs such as marijuana, cocaine, or opioids. ?Excessive use of alcohol. ?Smoking. ?Lack of physical activity or exercise. ?Psychological causes, such as: ?Anxiety or stress. ?Sadness or depression. ?Exhaustion. ?Fear about sexual performance. ?Guilt. ?What are the signs or symptoms? ?Symptoms of this condition include: ?Inability to get an erection. ?Lack of enough hardness of the erection to allow penetration. ?Loss of the erection before sex is finished. ?Sometimes having normal erections, but with frequent unsatisfactory episodes. ?Low sexual satisfaction in either partner due to erection problems. ?A curved penis occurring with erection. The curve may cause pain, or the penis may be too curved to allow for intercourse. ?Never having nighttime or morning erections. ?How is this diagnosed? ?This condition is often diagnosed by: ?Performing a physical exam to find other diseases or specific problems with the penis. ?Asking you detailed questions about the problem. ?Doing tests,  such as: ?Blood tests to check for diabetes mellitus or high cholesterol, or to measure hormone levels. ?Other tests to check for underlying health conditions. ?An ultrasound exam to check for scarring. ?A test to check blood flow to the penis. ?Doing a sleep study at home to measure nighttime erections. ?How is this treated? ?This condition may be treated by: ?Medicines, such as: ?Medicine taken by mouth to help you achieve an erection (oral medicine). ?Hormone replacement therapy to replace low testosterone levels. ?Medicine that is injected into the penis. Your health care provider may instruct you how to give yourself these injections at home. ?Medicine that is delivered with a short applicator tube. The tube is inserted into the opening at the tip of the penis, which is the opening of the urethra. A tiny pellet of medicine is put in the urethra. The pellet dissolves and enhances erectile function. This is also called MUSE (medicated urethral system for erections) therapy. ?Vacuum pump. This is a pump with a ring on it. The pump and ring are placed on the penis and used to create pressure that helps the penis become erect. ?Penile implant surgery. In this procedure, you may receive: ?An inflatable implant. This consists of cylinders, a pump, and a reservoir. The cylinders can be inflated with a fluid that helps to create an erection, and they can be deflated after intercourse. ?A semi-rigid implant. This consists of two silicone rubber rods. The rods provide some rigidity. They are also flexible, so the penis can both curve downward in its normal position and become straight for sexual intercourse. ?Blood vessel surgery to improve blood flow to the penis. During this procedure, a blood vessel from a different part of the body is placed into the penis to allow blood to flow around (bypass) damaged or blocked blood vessels. ?Lifestyle changes,   such as exercising more, losing weight, and quitting smoking. ?Follow  these instructions at home: ?Medicines ? ?Take over-the-counter and prescription medicines only as told by your health care provider. Do not increase the dosage without first discussing it with your health care provider. ?If you are using self-injections, do injections as directed by your health care provider. Make sure you avoid any veins that are on the surface of the penis. After giving an injection, apply pressure to the injection site for 5 minutes. ?Talk to your health care provider about how to prevent headaches while taking ED medicines. These medicines may cause a sudden headache due to the increase in blood flow in your body. ?General instructions ?Exercise regularly, as directed by your health care provider. Work with your health care provider to lose weight, if needed. ?Do not use any products that contain nicotine or tobacco. These products include cigarettes, chewing tobacco, and vaping devices, such as e-cigarettes. If you need help quitting, ask your health care provider. ?Before using a vacuum pump, read the instructions that come with the pump and discuss any questions with your health care provider. ?Keep all follow-up visits. This is important. ?Contact a health care provider if: ?You feel nauseous. ?You are vomiting. ?You get sudden headaches while taking ED medicines. ?You have any concerns about your sexual health. ?Get help right away if: ?You are taking oral or injectable medicines and you have an erection that lasts longer than 4 hours. If your health care provider is unavailable, go to the nearest emergency room for evaluation. An erection that lasts much longer than 4 hours can result in permanent damage to your penis. ?You have severe pain in your groin or abdomen. ?You develop redness or severe swelling of your penis. ?You have redness spreading at your groin or lower abdomen. ?You are unable to urinate. ?You experience chest pain or a rapid heartbeat (palpitations) after taking oral  medicines. ?These symptoms may represent a serious problem that is an emergency. Do not wait to see if the symptoms will go away. Get medical help right away. Call your local emergency services (911 in the [place] Do not drive yourself to the hospital. ?Summary ?Erectile dysfunction (ED) is the inability to get or keep an erection during sexual intercourse. ?This condition is diagnosed based on a physical exam, your symptoms, and tests to determine the cause. Treatment varies depending on the cause and may include medicines, hormone therapy, surgery, or a vacuum pump. ?You may need follow-up visits to make sure that you are using your medicines or devices correctly. ?Get help right away if you are taking or injecting medicines and you have an erection that lasts longer than 4 hours. ?This information is not intended to replace advice given to you by your health care provider. Make sure you discuss any questions you have with your health care provider. ?Document Revised: 08/01/2020 Document Reviewed: 08/01/2020 ?Elsevier Patient Education ? 2023 Elsevier Inc. ? ?

## 2021-09-17 ENCOUNTER — Ambulatory Visit: Payer: BC Managed Care – PPO | Admitting: Family Medicine

## 2021-09-24 ENCOUNTER — Ambulatory Visit (INDEPENDENT_AMBULATORY_CARE_PROVIDER_SITE_OTHER): Payer: Self-pay | Admitting: *Deleted

## 2021-09-24 VITALS — Ht 69.0 in | Wt 220.0 lb

## 2021-09-24 DIAGNOSIS — Z86010 Personal history of colonic polyps: Secondary | ICD-10-CM

## 2021-09-24 NOTE — Progress Notes (Signed)
Gastroenterology Pre-Procedure Review ? ?Request Date: 09/24/2021 ?Requesting Physician: Dr. Sallee Lange, Last TCS done 08/18/2016 by Dr. 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 ?PATIENT REVIEW QUESTIONS: The patient responded to the following health history questions as indicated:   ? ?1. Diabetes Melitis: yes, type II  ?2. Joint replacements in the past 12 months: no ?3. Major health problems in the past 3 months: no ?4. Has an artificial valve or MVP: no ?5. Has a defibrillator: no ?6. Has been advised in past to take antibiotics in advance of a procedure like teeth cleaning: no ?7. Family history of colon cancer: no  ?8. Alcohol Use: no ?9. Illicit drug Use: no ?10. History of sleep apnea: no  ?11. History of coronary artery or other vascular stents placed within the last 12 months: no ?12. History of any prior anesthesia complications: no ?13. Body mass index is 32.49 kg/m?. ?   ?MEDICATIONS & ALLERGIES:    ?Patient reports the following regarding taking any blood thinners:   ?Plavix? no ?Aspirin? no ?Coumadin? no ?Brilinta? no ?Xarelto? no ?Eliquis? no ?Pradaxa? no ?Savaysa? no ?Effient? no ? ?Patient confirms/reports the following medications:  ?Current Outpatient Medications  ?Medication Sig Dispense Refill  ? amLODipine (NORVASC) 10 MG tablet TAKE ONE TABLET BY MOUTH ONCE DAILY. 30 tablet 0  ? ezetimibe (ZETIA) 10 MG tablet Take 1 tablet (10 mg total) by mouth daily. 90 tablet 3  ? glipiZIDE (GLUCOTROL XL) 5 MG 24 hr tablet TAKE 1 TABLET BY MOUTH ONCE DAILY WITH BREAKFAST. 30 tablet 0  ? JARDIANCE 25 MG TABS tablet TAKE ONE TABLET BY MOUTH ONCE DAILY. 30 tablet 0  ? meloxicam (MOBIC) 15 MG tablet Take 1 tablet (15 mg total) by mouth daily. 14 tablet 0  ? metFORMIN (GLUCOPHAGE-XR) 500 MG 24 hr tablet TAKE 1 TABLET BY MOUTH TWICE DAILY. 60 tablet 0  ? PARoxetine (PAXIL) 10 MG tablet Take 1 tablet (10 mg total) by mouth daily. 30 tablet 11  ? rosuvastatin (CRESTOR) 40 MG tablet TAKE ONE TABLET BY MOUTH ONCE  DAILY. 30 tablet 0  ? tadalafil (CIALIS) 20 MG tablet Take 1 tablet (20 mg total) by mouth daily as needed for erectile dysfunction. 10 tablet 5  ? valsartan-hydrochlorothiazide (DIOVAN-HCT) 80-12.5 MG tablet TAKE 1 TABLET BY MOUTH ONCE A DAY. 30 tablet 0  ? ?No current facility-administered medications for this visit.  ? ? ?Patient confirms/reports the following allergies:  ?No Known Allergies ? ?No orders of the defined types were placed in this encounter. ? ? ?AUTHORIZATION INFORMATION ?Primary Insurance: [place],  [place] #: [id number],  Group #: [id number] ?Pre-Cert / Josem Kaufmann required:  ?Pre-Cert / Auth #:  ? ?SCHEDULE INFORMATION: ?Procedure has been scheduled as follows:  ?Date: , Time:   ?Location: [facility] with Dr. Abbey Chatters ? ?This Gastroenterology Pre-Precedure Review Form is being routed to the following provider(s): Roseanne Kaufman, NP ?  ?

## 2021-09-27 ENCOUNTER — Ambulatory Visit: Payer: BC Managed Care – PPO | Admitting: Family Medicine

## 2021-09-27 VITALS — BP 132/78 | HR 88 | Temp 98.1°F | Ht 69.0 in | Wt 222.4 lb

## 2021-09-27 DIAGNOSIS — E785 Hyperlipidemia, unspecified: Secondary | ICD-10-CM | POA: Diagnosis not present

## 2021-09-27 DIAGNOSIS — I700 Atherosclerosis of aorta: Secondary | ICD-10-CM

## 2021-09-27 DIAGNOSIS — E118 Type 2 diabetes mellitus with unspecified complications: Secondary | ICD-10-CM | POA: Diagnosis not present

## 2021-09-27 DIAGNOSIS — E1169 Type 2 diabetes mellitus with other specified complication: Secondary | ICD-10-CM | POA: Diagnosis not present

## 2021-09-27 DIAGNOSIS — I10 Essential (primary) hypertension: Secondary | ICD-10-CM

## 2021-09-27 NOTE — Progress Notes (Signed)
? ?  Subjective:  ? ? Patient ID: Fernando Vance, male    DOB: [birth year], 61 y.o.   MRN: [id number] ? ?HPI ? ?Patient here for 6 month follow up. ?HTN (hypertension), benign ? ?Hyperlipidemia associated with type 2 diabetes mellitus ([place]), Chronic ? ?Controlled diabetes mellitus type 2 with complications, unspecified whether long term insulin use ([place]) ?Patient trying to do the best he can and eating healthy ?Takes his medicine regular basis ?Denies any low sugars ?States blood pressure under good control ?Stays busy with his job at [employer] ?No recent setbacks ?Moods are doing well ?Being followed with gastroenterology for upcoming colonoscopy ?Review of Systems ? ?   ?Objective:  ? Physical Exam ?General-in no acute distress ?Eyes-no discharge ?Lungs-respiratory rate normal, CTA ?CV-slight murmurs,RRR ?Extremities skin warm dry no edema ?Neuro grossly normal ?Behavior normal, alert ? ? ? ? ?   ?Assessment & Plan:  ?Diabetes-good control overall continue current medication.  Await the results of his lab work which is being completed may need adjustments depending on this ? ?Blood pressure good control continue current measures ? ?Continue cholesterol medicine watch diet closely follow-up lab work accordingly may need adjustments on medicine depending on where his numbers are ? ?Erectile dysfunction followed by urology ?Patient does have aortic calcification ?Had echo completed back in 2019 ?They recommended another one in 2023 ?We will help set this ?

## 2021-09-27 NOTE — Patient Instructions (Signed)
Hi Fernando Vance ? ?Always a pleasure seeing you ?Continue to do well with eating and healthy habits ? ?We will let you know the results of your lab work ? ?We will see you back in 6 months sooner if any problems ? ?TakeCare-Dr. Nicki Reaper ?

## 2021-09-28 LAB — LIPID PANEL
Chol/HDL Ratio: 3.2 ratio (ref 0.0–5.0)
Cholesterol, Total: 171 mg/dL (ref 100–199)
HDL: 53 mg/dL (ref >39)
LDL Chol Calc (NIH): 90 mg/dL (ref 0–99)
Triglycerides: 166 mg/dL — ABNORMAL HIGH (ref 0–149)
VLDL Cholesterol Cal: 28 mg/dL (ref 5–40)

## 2021-09-28 LAB — BASIC METABOLIC PANEL
BUN/Creatinine Ratio: 16 (ref 9–20)
BUN: 19 mg/dL (ref 6–24)
CO2: 24 mmol/L (ref 20–29)
Calcium: 9.7 mg/dL (ref 8.7–10.2)
Chloride: 101 mmol/L (ref 96–106)
Creatinine, Ser: 1.2 mg/dL (ref 0.76–1.27)
Glucose: 151 mg/dL — ABNORMAL HIGH (ref 70–99)
Potassium: 4.4 mmol/L (ref 3.5–5.2)
Sodium: 141 mmol/L (ref 134–144)
eGFR: 70 mL/min/{1.73_m2} (ref >59)

## 2021-09-28 LAB — HEMOGLOBIN A1C
Est. average glucose Bld gHb Est-mCnc: 186 mg/dL
Hgb A1c MFr Bld: 8.1 % — ABNORMAL HIGH (ref 4.8–5.6)

## 2021-10-01 NOTE — Progress Notes (Signed)
Left message for patient to return the call for additional details and recommendations.   

## 2021-10-03 NOTE — Progress Notes (Signed)
Left message to return call (10/03/21)

## 2021-10-10 NOTE — Progress Notes (Signed)
10/10/2021-Lmom on both numbers for pt to call me back.

## 2021-10-10 NOTE — Progress Notes (Signed)
ASA 2. Hold oral diabetes medication day of procedure.

## 2021-10-11 ENCOUNTER — Encounter: Payer: Self-pay | Admitting: *Deleted

## 2021-10-11 NOTE — Progress Notes (Signed)
10/11/2021-Tried to call pt again today but had to leave another voice mail.  Mailed letter out to pt.

## 2021-10-24 ENCOUNTER — Encounter: Payer: Self-pay | Admitting: Family Medicine

## 2021-10-24 ENCOUNTER — Telehealth: Payer: Self-pay | Admitting: Family Medicine

## 2021-10-24 NOTE — Telephone Encounter (Signed)
Letter was dictated to the patient reminding him to get a follow-up echo, he can call us to schedule this otherwise we will discuss at his November visit

## 2021-11-27 ENCOUNTER — Other Ambulatory Visit: Payer: Self-pay | Admitting: Family Medicine

## 2021-12-02 ENCOUNTER — Ambulatory Visit: Payer: BC Managed Care – PPO | Admitting: Urology

## 2021-12-30 ENCOUNTER — Other Ambulatory Visit: Payer: Self-pay | Admitting: Family Medicine

## 2022-03-14 ENCOUNTER — Encounter: Payer: Self-pay | Admitting: Urology

## 2022-03-14 ENCOUNTER — Ambulatory Visit: Payer: BC Managed Care – PPO | Admitting: Urology

## 2022-03-14 VITALS — BP 154/95 | HR 88

## 2022-03-14 DIAGNOSIS — F524 Premature ejaculation: Secondary | ICD-10-CM | POA: Diagnosis not present

## 2022-03-14 DIAGNOSIS — N529 Male erectile dysfunction, unspecified: Secondary | ICD-10-CM

## 2022-03-14 MED ORDER — TADALAFIL 5 MG PO TABS: 5.0000 mg | ORAL_TABLET | Freq: Every day | ORAL | 11 refills | Status: AC

## 2022-03-14 MED ORDER — TADALAFIL 20 MG PO TABS: 20.0000 mg | ORAL_TABLET | Freq: Every day | ORAL | 5 refills | Status: DC | PRN

## 2022-03-14 MED ORDER — PAROXETINE HCL 10 MG PO TABS: 10.0000 mg | ORAL_TABLET | Freq: Every day | ORAL | 11 refills | Status: DC

## 2022-03-14 NOTE — Progress Notes (Unsigned)
03/14/2022 11:48 AM   Fernando Vance [birth year] [id number]  Referring provider: Kathyrn Drown, MD [address] [place],  [place] [address]  Followup erectile dysfunction and premature ejaculation   HPI: Fernando Vance is a 61yo here for followup for erectile dysfunction and premature ejaculation. He is able to get and erection but he cannot maintaining the erection to ejaculation. He is on cialis '20mg'$  prn and paxil '10mg'$  daily. Paxil works well for his premature ejaculation.    PMH: Past Medical History:  Diagnosis Date   Diabetes mellitus without complication ([place])    Hypertension     Surgical History: Past Surgical History:  Procedure Laterality Date   COLONOSCOPY N/A 08/18/2016   Procedure: COLONOSCOPY;  Surgeon: Danie Binder, MD;  Location: [facility];  Service: Endoscopy;  Laterality: N/A;  1030    POLYPECTOMY  08/18/2016   Procedure: POLYPECTOMY;  Surgeon: Danie Binder, MD;  Location: [facility];  Service: Endoscopy;;  sigmoid   WISDOM TOOTH EXTRACTION     20 years ago    Home Medications:  Allergies as of 03/14/2022   No Known Allergies      Medication List        Accurate as of March 14, 2022 11:48 AM. If you have any questions, ask your nurse or doctor.          amLODipine 10 MG tablet Commonly known as: NORVASC TAKE ONE TABLET BY MOUTH ONCE DAILY.   ezetimibe 10 MG tablet Commonly known as: Zetia Take 1 tablet (10 mg total) by mouth daily.   glipiZIDE 5 MG 24 hr tablet Commonly known as: GLUCOTROL XL TAKE 1 TABLET BY MOUTH ONCE DAILY WITH BREAKFAST.   Jardiance 25 MG Tabs tablet Generic drug: empagliflozin TAKE ONE TABLET BY MOUTH ONCE DAILY.   meloxicam 15 MG tablet Commonly known as: MOBIC Take 1 tablet (15 mg total) by mouth daily.   metFORMIN 500 MG 24 hr tablet Commonly known as: GLUCOPHAGE-XR TAKE 1 TABLET BY MOUTH TWICE DAILY.   PARoxetine 10 MG tablet Commonly known as: Paxil Take 1 tablet (10 mg  total) by mouth daily.   rosuvastatin 40 MG tablet Commonly known as: CRESTOR TAKE ONE TABLET BY MOUTH ONCE DAILY.   tadalafil 20 MG tablet Commonly known as: CIALIS Take 1 tablet (20 mg total) by mouth daily as needed for erectile dysfunction.   valsartan-hydrochlorothiazide 80-12.5 MG tablet Commonly known as: DIOVAN-HCT TAKE 1 TABLET BY MOUTH ONCE A DAY.        Allergies: No Known Allergies  Family History: Family History  Problem Relation Age of Onset   Diabetes Mother    Heart failure Father     Social History:  reports that he has never smoked. He has never used smokeless tobacco. He reports that he does not drink alcohol and does not use drugs.  ROS: All other review of systems were reviewed and are negative except what is noted above in HPI  Physical Exam: BP (!) 154/95   Pulse 88   Constitutional:  Alert and oriented, No acute distress. HEENT: [place] AT, moist mucus membranes.  Trachea midline, no masses. Cardiovascular: No clubbing, cyanosis, or edema. Respiratory: Normal respiratory effort, no increased work of breathing. GI: Abdomen is soft, nontender, nondistended, no abdominal masses GU: No CVA tenderness.  Lymph: No cervical or inguinal lymphadenopathy. Skin: No rashes, bruises or suspicious lesions. Neurologic: Grossly intact, no focal deficits, moving all 4 extremities. Psychiatric: Normal mood and affect.  Laboratory Data: Lab Results  Component Value Date   WBC 5.3 08/16/2019   HGB 14.1 08/16/2019   HCT 45.6 08/16/2019   MCV 73 (L) 08/16/2019   PLT 342 08/16/2019    Lab Results  Component Value Date   CREATININE 1.20 09/27/2021    No results found for: "PSA"  No results found for: "TESTOSTERONE"  Lab Results  Component Value Date   HGBA1C 8.1 (H) 09/27/2021    Urinalysis    Component Value Date/Time   APPEARANCEUR Clear 07/02/2021 1433   GLUCOSEU 3+ (A) 07/02/2021 1433   BILIRUBINUR Negative 07/02/2021 1433   PROTEINUR 1+ (A)  07/02/2021 1433   NITRITE Negative 07/02/2021 1433   LEUKOCYTESUR Negative 07/02/2021 1433    Lab Results  Component Value Date   LABMICR See below: 07/02/2021   WBCUA None seen 07/02/2021   LABEPIT None seen 07/02/2021   MUCUS Present 07/02/2021   BACTERIA Few 07/02/2021    Pertinent Imaging:  No results found for this or any previous visit.  No results found for this or any previous visit.  No results found for this or any previous visit.  No results found for this or any previous visit.  No results found for this or any previous visit.  No valid procedures specified. No results found for this or any previous visit.  No results found for this or any previous visit.   Assessment & Plan:    1. Erectile dysfunction, unspecified erectile dysfunction type -tadalafil '5mg'$  daily and '20mg'$  prn  2. Premature ejaculation Continue paxil '10mg'$  daily   No follow-ups on file.  Nicolette Bang, MD  [facility] [place]

## 2022-03-14 NOTE — Patient Instructions (Signed)
Erectile Dysfunction ?Erectile dysfunction (ED) is the inability to get or keep an erection in order to have sexual intercourse. ED is considered a symptom of an underlying disorder and is not considered a disease. ED may include: ?Inability to get an erection. ?Lack of enough hardness of the erection to allow penetration. ?Loss of erection before sex is finished. ?What are the causes? ?This condition may be caused by: ?Physical causes, such as: ?Artery problems. This may include heart disease, high blood pressure, atherosclerosis, and diabetes. ?Hormonal problems, such as low testosterone. ?Obesity. ?Nerve problems. This may include back or pelvic injuries, multiple sclerosis, Parkinson's disease, spinal cord injury, and stroke. ?Certain medicines, such as: ?Pain relievers. ?Antidepressants. ?Blood pressure medicines and water pills (diuretics). ?Cancer medicines. ?Antihistamines. ?Muscle relaxants. ?Lifestyle factors, such as: ?Use of drugs such as marijuana, cocaine, or opioids. ?Excessive use of alcohol. ?Smoking. ?Lack of physical activity or exercise. ?Psychological causes, such as: ?Anxiety or stress. ?Sadness or depression. ?Exhaustion. ?Fear about sexual performance. ?Guilt. ?What are the signs or symptoms? ?Symptoms of this condition include: ?Inability to get an erection. ?Lack of enough hardness of the erection to allow penetration. ?Loss of the erection before sex is finished. ?Sometimes having normal erections, but with frequent unsatisfactory episodes. ?Low sexual satisfaction in either partner due to erection problems. ?A curved penis occurring with erection. The curve may cause pain, or the penis may be too curved to allow for intercourse. ?Never having nighttime or morning erections. ?How is this diagnosed? ?This condition is often diagnosed by: ?Performing a physical exam to find other diseases or specific problems with the penis. ?Asking you detailed questions about the problem. ?Doing tests,  such as: ?Blood tests to check for diabetes mellitus or high cholesterol, or to measure hormone levels. ?Other tests to check for underlying health conditions. ?An ultrasound exam to check for scarring. ?A test to check blood flow to the penis. ?Doing a sleep study at home to measure nighttime erections. ?How is this treated? ?This condition may be treated by: ?Medicines, such as: ?Medicine taken by mouth to help you achieve an erection (oral medicine). ?Hormone replacement therapy to replace low testosterone levels. ?Medicine that is injected into the penis. Your health care provider may instruct you how to give yourself these injections at home. ?Medicine that is delivered with a short applicator tube. The tube is inserted into the opening at the tip of the penis, which is the opening of the urethra. A tiny pellet of medicine is put in the urethra. The pellet dissolves and enhances erectile function. This is also called MUSE (medicated urethral system for erections) therapy. ?Vacuum pump. This is a pump with a ring on it. The pump and ring are placed on the penis and used to create pressure that helps the penis become erect. ?Penile implant surgery. In this procedure, you may receive: ?An inflatable implant. This consists of cylinders, a pump, and a reservoir. The cylinders can be inflated with a fluid that helps to create an erection, and they can be deflated after intercourse. ?A semi-rigid implant. This consists of two silicone rubber rods. The rods provide some rigidity. They are also flexible, so the penis can both curve downward in its normal position and become straight for sexual intercourse. ?Blood vessel surgery to improve blood flow to the penis. During this procedure, a blood vessel from a different part of the body is placed into the penis to allow blood to flow around (bypass) damaged or blocked blood vessels. ?Lifestyle changes,   such as exercising more, losing weight, and quitting smoking. ?Follow  these instructions at home: ?Medicines ? ?Take over-the-counter and prescription medicines only as told by your health care provider. Do not increase the dosage without first discussing it with your health care provider. ?If you are using self-injections, do injections as directed by your health care provider. Make sure you avoid any veins that are on the surface of the penis. After giving an injection, apply pressure to the injection site for 5 minutes. ?Talk to your health care provider about how to prevent headaches while taking ED medicines. These medicines may cause a sudden headache due to the increase in blood flow in your body. ?General instructions ?Exercise regularly, as directed by your health care provider. Work with your health care provider to lose weight, if needed. ?Do not use any products that contain nicotine or tobacco. These products include cigarettes, chewing tobacco, and vaping devices, such as e-cigarettes. If you need help quitting, ask your health care provider. ?Before using a vacuum pump, read the instructions that come with the pump and discuss any questions with your health care provider. ?Keep all follow-up visits. This is important. ?Contact a health care provider if: ?You feel nauseous. ?You are vomiting. ?You get sudden headaches while taking ED medicines. ?You have any concerns about your sexual health. ?Get help right away if: ?You are taking oral or injectable medicines and you have an erection that lasts longer than 4 hours. If your health care provider is unavailable, go to the nearest emergency room for evaluation. An erection that lasts much longer than 4 hours can result in permanent damage to your penis. ?You have severe pain in your groin or abdomen. ?You develop redness or severe swelling of your penis. ?You have redness spreading at your groin or lower abdomen. ?You are unable to urinate. ?You experience chest pain or a rapid heartbeat (palpitations) after taking oral  medicines. ?These symptoms may represent a serious problem that is an emergency. Do not wait to see if the symptoms will go away. Get medical help right away. Call your local emergency services (911 in the [place] Do not drive yourself to the hospital. ?Summary ?Erectile dysfunction (ED) is the inability to get or keep an erection during sexual intercourse. ?This condition is diagnosed based on a physical exam, your symptoms, and tests to determine the cause. Treatment varies depending on the cause and may include medicines, hormone therapy, surgery, or a vacuum pump. ?You may need follow-up visits to make sure that you are using your medicines or devices correctly. ?Get help right away if you are taking or injecting medicines and you have an erection that lasts longer than 4 hours. ?This information is not intended to replace advice given to you by your health care provider. Make sure you discuss any questions you have with your health care provider. ?Document Revised: 08/01/2020 Document Reviewed: 08/01/2020 ?Elsevier Patient Education ? 2023 Elsevier Inc. ? ?

## 2022-04-01 ENCOUNTER — Ambulatory Visit: Payer: Self-pay | Admitting: Family Medicine

## 2022-04-08 ENCOUNTER — Other Ambulatory Visit: Payer: Self-pay | Admitting: Family Medicine

## 2022-04-16 ENCOUNTER — Ambulatory Visit: Payer: BC Managed Care – PPO | Admitting: Family Medicine

## 2022-04-16 VITALS — BP 136/74 | HR 80 | Temp 98.2°F | Ht 69.0 in | Wt 228.0 lb

## 2022-04-16 DIAGNOSIS — I359 Nonrheumatic aortic valve disorder, unspecified: Secondary | ICD-10-CM

## 2022-04-16 DIAGNOSIS — E785 Hyperlipidemia, unspecified: Secondary | ICD-10-CM

## 2022-04-16 DIAGNOSIS — E118 Type 2 diabetes mellitus with unspecified complications: Secondary | ICD-10-CM | POA: Diagnosis not present

## 2022-04-16 DIAGNOSIS — I10 Essential (primary) hypertension: Secondary | ICD-10-CM

## 2022-04-16 DIAGNOSIS — Z23 Encounter for immunization: Secondary | ICD-10-CM | POA: Diagnosis not present

## 2022-04-16 DIAGNOSIS — Z125 Encounter for screening for malignant neoplasm of prostate: Secondary | ICD-10-CM | POA: Diagnosis not present

## 2022-04-16 DIAGNOSIS — E1169 Type 2 diabetes mellitus with other specified complication: Secondary | ICD-10-CM

## 2022-04-16 DIAGNOSIS — Z1211 Encounter for screening for malignant neoplasm of colon: Secondary | ICD-10-CM

## 2022-04-16 MED ORDER — METFORMIN HCL ER 500 MG PO TB24: 500.0000 mg | ORAL_TABLET | Freq: Two times a day (BID) | ORAL | 1 refills | Status: DC

## 2022-04-16 MED ORDER — AMLODIPINE BESYLATE 10 MG PO TABS: 10.0000 mg | ORAL_TABLET | Freq: Every day | ORAL | 1 refills | Status: DC

## 2022-04-16 MED ORDER — GLIPIZIDE ER 5 MG PO TB24: 5.0000 mg | ORAL_TABLET | Freq: Every day | ORAL | 1 refills | Status: DC

## 2022-04-16 MED ORDER — EMPAGLIFLOZIN 25 MG PO TABS: 25.0000 mg | ORAL_TABLET | Freq: Every day | ORAL | 1 refills | Status: DC

## 2022-04-16 MED ORDER — VALSARTAN-HYDROCHLOROTHIAZIDE 80-12.5 MG PO TABS: 1.0000 | ORAL_TABLET | Freq: Every day | ORAL | 1 refills | Status: DC

## 2022-04-16 MED ORDER — ROSUVASTATIN CALCIUM 40 MG PO TABS: 40.0000 mg | ORAL_TABLET | Freq: Every day | ORAL | 1 refills | Status: DC

## 2022-04-16 NOTE — Progress Notes (Signed)
Subjective:    Patient ID: Fernando Vance, male    DOB: Dec 12, 1960, 61 y.o.   MRN: [id number]  HPI Immunization due - Plan: Flu Vaccine QUAD 17moIM (Fluarix, Fluzone & Alfiuria Quad PF)  HTN (hypertension), benign - Plan: PSA, Hemoglobin AM0Q Basic Metabolic Panel, Lipid Panel, Hepatic Function Panel, Microalbumin/Creatinine Ratio, Urine, ECHOCARDIOGRAM COMPLETE  Hyperlipidemia associated with type 2 diabetes mellitus ([place] - Plan: PSA, Hemoglobin AQ7Y Basic Metabolic Panel, Lipid Panel, Hepatic Function Panel, Microalbumin/Creatinine Ratio, Urine, ECHOCARDIOGRAM COMPLETE  Controlled diabetes mellitus type 2 with complications, unspecified whether long term insulin use ([facility]) - Plan: PSA, Hemoglobin AP9J Basic Metabolic Panel, Lipid Panel, Hepatic Function Panel, Microalbumin/Creatinine Ratio, Urine  Aortic valve calcification - Plan: PSA, Hemoglobin AK9T Basic Metabolic Panel, Lipid Panel, Hepatic Function Panel, Microalbumin/Creatinine Ratio, Urine, ECHOCARDIOGRAM COMPLETE  Screening for colon cancer - Plan: Ambulatory referral to Gastroenterology  The patient was seen today as part of a comprehensive diabetic check up. Patient has diabetes Patient relates good compliance with taking the medication. We discussed their diet and exercise activities  We also discussed the importance of notifying [name] any excessively high glucoses or low sugars.   Patient here for follow-up regarding cholesterol.    Patient relates taking medication on a regular basis Denies problems with medication Importance of dietary measures discussed Regular lab work regarding lipid and liver was checked and if needing additional labs was appropriately ordered Patient for blood pressure check up.  The patient does have hypertension.   Patient relates dietary measures try to minimize salt The importance of healthy diet and activity were discussed Patient relates compliance  Patient with a history of aortic  valve calcification.  Denies any significant shortness of breath with activity.  No chest pressure tightness   Review of Systems     Objective:   Physical Exam  General-in no acute distress Eyes-no discharge Lungs-respiratory rate normal, CTA CV-mild systolic murmur murmurs,RRR Extremities skin warm dry no edema Neuro grossly normal Behavior normal, alert 0      Assessment & Plan:  1. Immunization due Flu shot today - Flu Vaccine QUAD 620moM (Fluarix, Fluzone & Alfiuria Quad PF)  2. HTN (hypertension), benign Under good control continue meds HTN- patient seen for follow-up regarding HTN.   Diet, medication compliance, appropriate labs and refills were completed.   Importance of keeping blood pressure under good control to lessen the risk of complications discussed Regular follow-up visits discussed  - PSA - Hemoglobin A1O6Z Basic Metabolic Panel - Lipid Panel - Hepatic Function Panel - Microalbumin/Creatinine Ratio, Urine - ECHOCARDIOGRAM COMPLETE  3. Hyperlipidemia associated with type 2 diabetes mellitus ([place]eds to do lab work await lab work previous labs look good continue meds.bslip  - PSA - Hemoglobin A1T2W Basic Metabolic Panel - Lipid Panel - Hepatic Function Panel - Microalbumin/Creatinine Ratio, Urine - ECHOCARDIOGRAM COMPLETE  4. Controlled diabetes mellitus type 2 with complications, unspecified whether long term insulin use ([place] control in the past needs to do lab work await results healthy diet recommendeThe patient was seen today as part of a comprehensive visit for diabetes. The importance of keeping her A1c at or below 7 range was discussed.  Discussed diet, activity, and medication compliance Emphasized healthy eating primarily with vegetables fruits and if utilizing meats lean meats such as chicken or fish grilled baked broiled Avoid sugary drinks Minimize and avoid processed foods Fit in regular physical activity preferably 25 to  30 minutes 4 times per week Standard  follow-up visit recommended.  Patient aware lack of control and follow-up increases risk of diabetic complications. Regular follow-up visits Yearly ophthalmology Yearly foot exam s - PSA - Hemoglobin X2J - Basic Metabolic Panel - Lipid Panel - Hepatic Function Panel - Microalbumin/Creatinine Ratio, Urine  5. Aortic valve calcification Need to do echo referral made - PSA - Hemoglobin J9E - Basic Metabolic Panel - Lipid Panel - Hepatic Function Panel - Microalbumin/Creatinine Ratio, Urine - ECHOCARDIOGRAM COMPLETE  6. Screening for colon cancer  patient due for colon cancer screening - Ambulatory referral to Gastroenterology

## 2022-04-17 ENCOUNTER — Encounter: Payer: Self-pay | Admitting: *Deleted

## 2022-04-18 LAB — LIPID PANEL
Chol/HDL Ratio: 3.4 ratio (ref 0.0–5.0)
Cholesterol, Total: 160 mg/dL (ref 100–199)
HDL: 47 mg/dL (ref >39)
LDL Chol Calc (NIH): 84 mg/dL (ref 0–99)
Triglycerides: 166 mg/dL — ABNORMAL HIGH (ref 0–149)
VLDL Cholesterol Cal: 29 mg/dL (ref 5–40)

## 2022-04-18 LAB — HEMOGLOBIN A1C
Est. average glucose Bld gHb Est-mCnc: 186 mg/dL
Hgb A1c MFr Bld: 8.1 % — ABNORMAL HIGH (ref 4.8–5.6)

## 2022-04-18 LAB — BASIC METABOLIC PANEL
BUN/Creatinine Ratio: 18 (ref 9–20)
BUN: 21 mg/dL (ref 6–24)
CO2: 24 mmol/L (ref 20–29)
Calcium: 9.8 mg/dL (ref 8.7–10.2)
Chloride: 101 mmol/L (ref 96–106)
Creatinine, Ser: 1.14 mg/dL (ref 0.76–1.27)
Glucose: 142 mg/dL — ABNORMAL HIGH (ref 70–99)
Potassium: 4.8 mmol/L (ref 3.5–5.2)
Sodium: 142 mmol/L (ref 134–144)
eGFR: 74 mL/min/{1.73_m2} (ref >59)

## 2022-04-18 LAB — HEPATIC FUNCTION PANEL
ALT: 22 IU/L (ref 0–44)
AST: 17 IU/L (ref 0–40)
Albumin: 5.2 g/dL — ABNORMAL HIGH (ref 3.8–4.9)
Alkaline Phosphatase: 88 IU/L (ref 44–121)
Bilirubin Total: 0.8 mg/dL (ref 0.0–1.2)
Bilirubin, Direct: 0.23 mg/dL (ref 0.00–0.40)
Total Protein: 8.1 g/dL (ref 6.0–8.5)

## 2022-04-18 LAB — MICROALBUMIN / CREATININE URINE RATIO
Creatinine, Urine: 70.7 mg/dL
Microalb/Creat Ratio: 88 mg/g creat — ABNORMAL HIGH (ref 0–29)
Microalbumin, Urine: 61.9 ug/mL

## 2022-04-18 LAB — PSA: Prostate Specific Ag, Serum: 0.5 ng/mL (ref 0.0–4.0)

## 2022-05-04 IMAGING — DX DG WRIST COMPLETE 3+V*L*
4 series · 4 of 4 positions shown · non-contrast
Comparison: None

CLINICAL DATA: Left wrist pain and swelling.  No known injury.

EXAM:
LEFT WRIST - COMPLETE 3+ VIEW

[wrist pa]
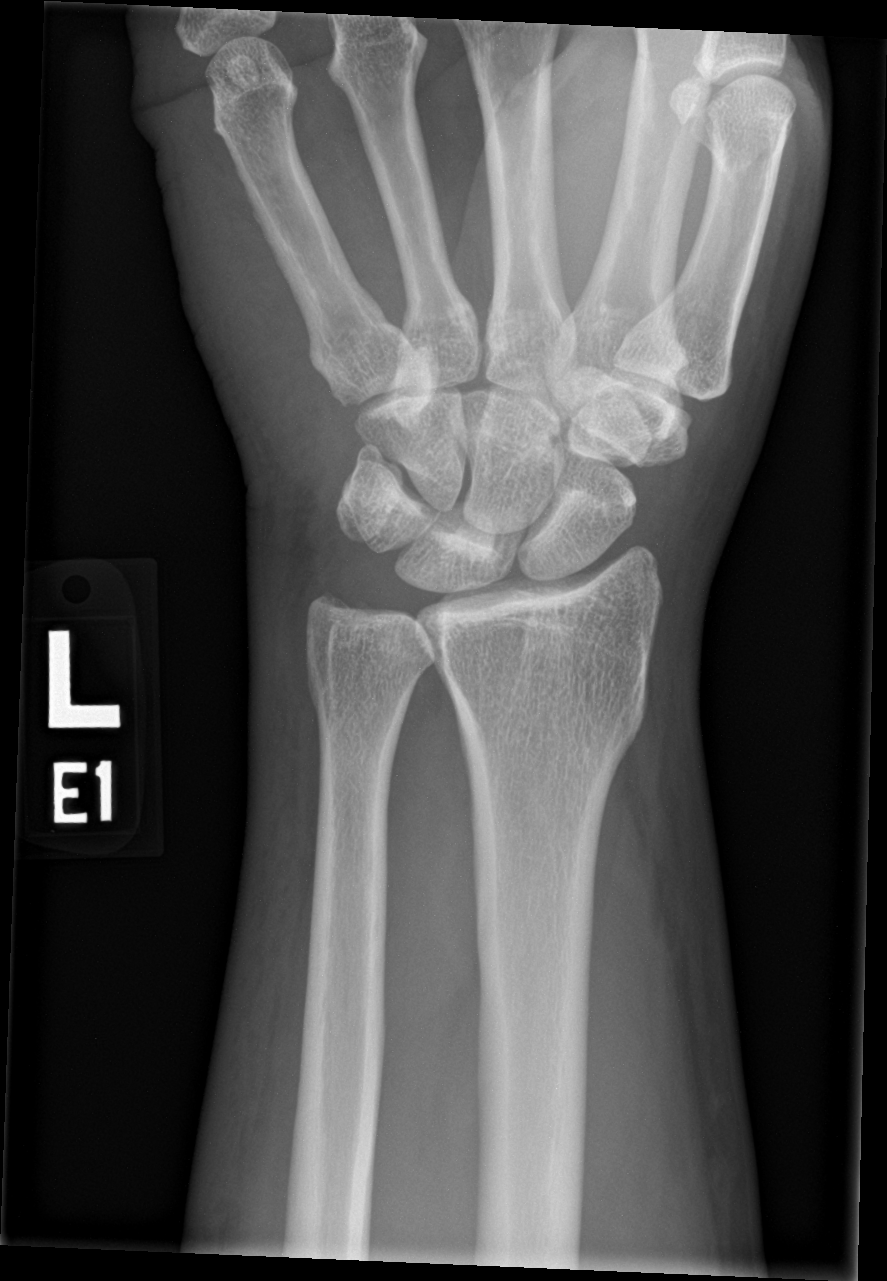

[wrist obl]
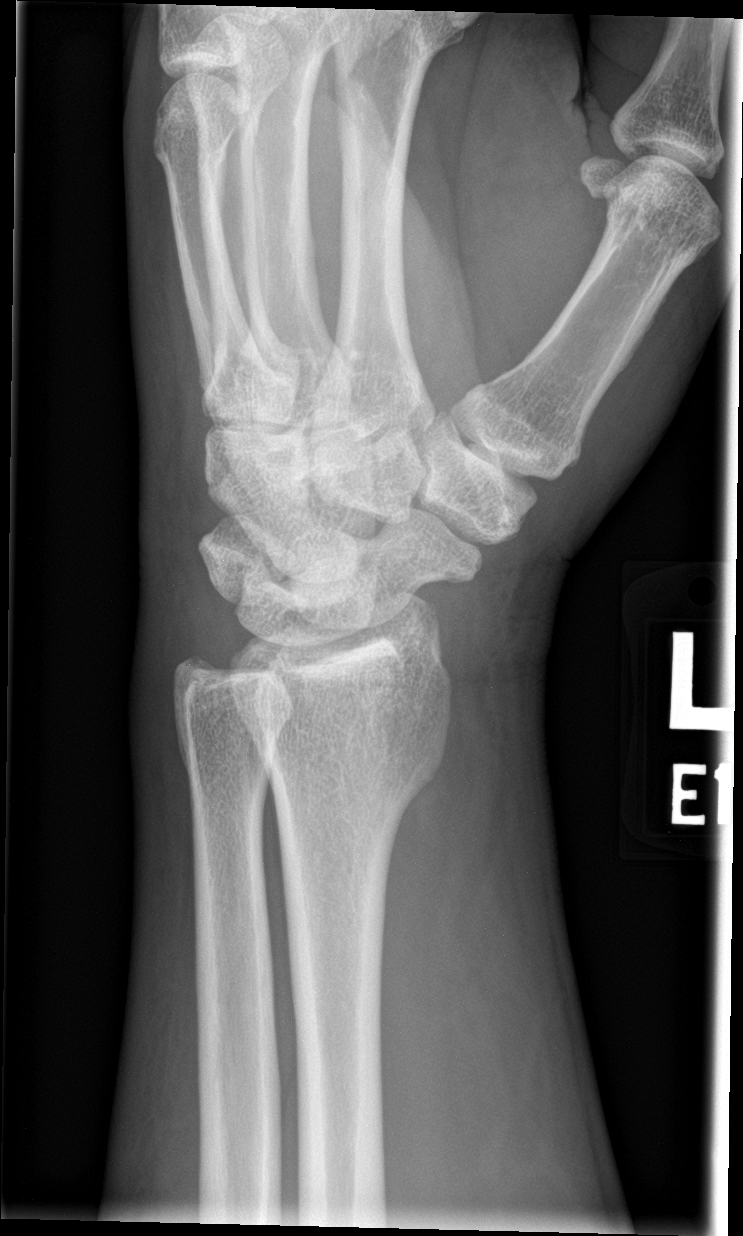

[wrist lat]
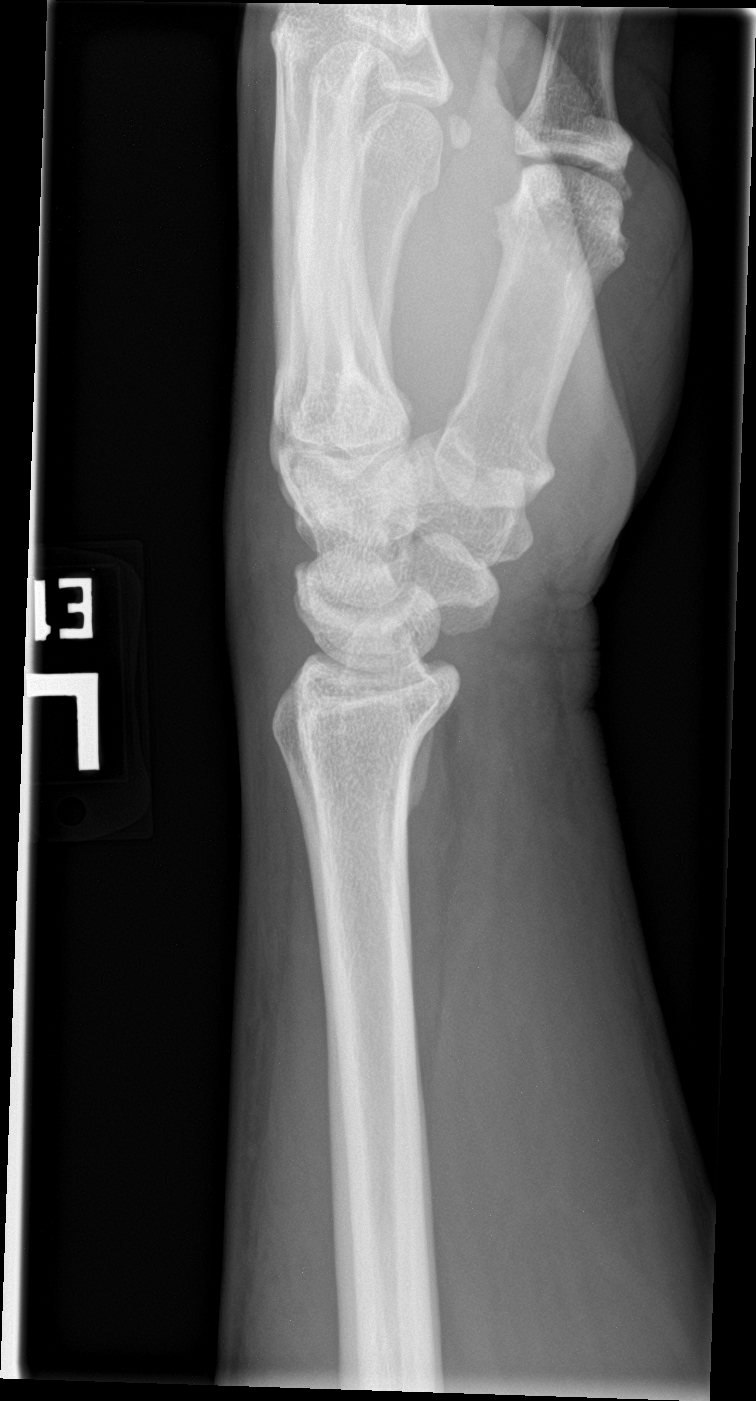

[wrist navicular]
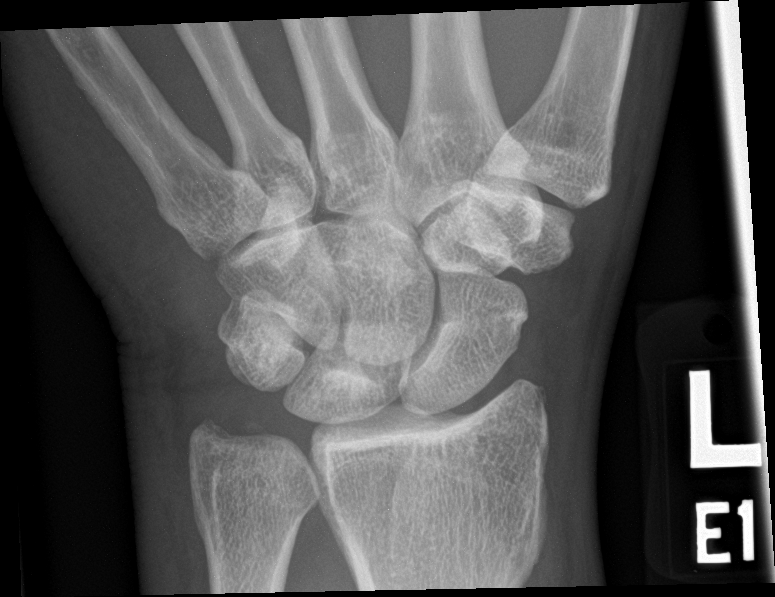

[4 of 4 positions shown; findings below may reference images not displayed]

FINDINGS: No evidence of fracture. No evidence of degenerative or erosive
arthritis. No other focal bone finding.
IMPRESSION: Negative.

## 2022-05-07 ENCOUNTER — Other Ambulatory Visit: Payer: Self-pay

## 2022-05-07 MED ORDER — VALSARTAN-HYDROCHLOROTHIAZIDE 80-12.5 MG PO TABS: 1.0000 | ORAL_TABLET | Freq: Every day | ORAL | 1 refills | Status: DC

## 2022-05-15 ENCOUNTER — Other Ambulatory Visit: Payer: Self-pay | Admitting: Family Medicine

## 2022-06-11 ENCOUNTER — Ambulatory Visit: Payer: BC Managed Care – PPO | Admitting: Family Medicine

## 2022-06-11 ENCOUNTER — Encounter: Payer: Self-pay | Admitting: Family Medicine

## 2022-06-11 VITALS — BP 138/78 | HR 97 | Temp 98.0°F | Ht 69.0 in | Wt 225.0 lb

## 2022-06-11 DIAGNOSIS — E1169 Type 2 diabetes mellitus with other specified complication: Secondary | ICD-10-CM | POA: Diagnosis not present

## 2022-06-11 DIAGNOSIS — I10 Essential (primary) hypertension: Secondary | ICD-10-CM

## 2022-06-11 DIAGNOSIS — I700 Atherosclerosis of aorta: Secondary | ICD-10-CM

## 2022-06-11 DIAGNOSIS — E118 Type 2 diabetes mellitus with unspecified complications: Secondary | ICD-10-CM

## 2022-06-11 DIAGNOSIS — E785 Hyperlipidemia, unspecified: Secondary | ICD-10-CM

## 2022-06-11 NOTE — Progress Notes (Addendum)
   Subjective:    Patient ID: Fernando Vance, male    DOB: 1960-06-08, 62 y.o.   MRN: [id number]  HPI Fernando Vance to Er at [place] 05/20/22, light headed at work  Diagnosed with RSV, had CT, EKG, and blood work done  Was given albuterol inhaler only Current symptoms laryngitis  ER diagnosed with RSV They did a bunch of test came back all normal He states he is feeling better now but still having hoarseness and laryngitis  Review of Systems     Objective:   Physical Exam General-in no acute distress Eyes-no discharge Lungs-respiratory rate normal, CTA CV-no murmurs,RRR Extremities skin warm dry no edema Neuro grossly normal Behavior normal, alert Hoarseness noted        Assessment & Plan:  Lightheaded RSV Near passing out Blood pressure moderately elevated Patient will do lab work because A1c was up Healthy eating discussed Follow-up in early April do lab work before that visit Laryngitis related to recent illness he was not having this before the illness if he has any progressive troubles or worrisome issues he is to let us know if he is not doing much better with his voice by mid February recommend ENT Will go ahead with Y3K and also metabolic 7 Previous lipid overall reasonable Tolerating rosuvastatin  I was able to get the ER records from his visit with [facility].  Lab work overall look good.  CAT scan did show some slight atrophy as well as dilation of the ventricles but patient neurologically is stable.

## 2022-06-16 DIAGNOSIS — H524 Presbyopia: Secondary | ICD-10-CM | POA: Diagnosis not present

## 2022-06-16 DIAGNOSIS — E1136 Type 2 diabetes mellitus with diabetic cataract: Secondary | ICD-10-CM | POA: Diagnosis not present

## 2022-06-16 DIAGNOSIS — H25813 Combined forms of age-related cataract, bilateral: Secondary | ICD-10-CM | POA: Diagnosis not present

## 2022-06-16 DIAGNOSIS — Z7984 Long term (current) use of oral hypoglycemic drugs: Secondary | ICD-10-CM | POA: Diagnosis not present

## 2022-06-16 LAB — HM DIABETES EYE EXAM

## 2022-06-18 ENCOUNTER — Ambulatory Visit: Payer: BC Managed Care – PPO | Admitting: Urology

## 2022-06-18 ENCOUNTER — Encounter: Payer: Self-pay | Admitting: *Deleted

## 2022-07-23 ENCOUNTER — Encounter: Payer: Self-pay | Admitting: Urology

## 2022-07-23 ENCOUNTER — Ambulatory Visit: Payer: BC Managed Care – PPO | Admitting: Urology

## 2022-07-23 VITALS — BP 150/83 | HR 97 | Wt 220.0 lb

## 2022-07-23 DIAGNOSIS — N529 Male erectile dysfunction, unspecified: Secondary | ICD-10-CM

## 2022-07-23 DIAGNOSIS — F524 Premature ejaculation: Secondary | ICD-10-CM

## 2022-07-23 MED ORDER — VARDENAFIL HCL 20 MG PO TABS: 20.0000 mg | ORAL_TABLET | Freq: Every day | ORAL | 0 refills | Status: DC | PRN

## 2022-07-23 MED ORDER — PAROXETINE HCL 10 MG PO TABS: 10.0000 mg | ORAL_TABLET | Freq: Every day | ORAL | 11 refills | Status: DC

## 2022-07-23 NOTE — Patient Instructions (Signed)
Erectile Dysfunction ?Erectile dysfunction (ED) is the inability to get or keep an erection in order to have sexual intercourse. ED is considered a symptom of an underlying disorder and is not considered a disease. ED may include: ?Inability to get an erection. ?Lack of enough hardness of the erection to allow penetration. ?Loss of erection before sex is finished. ?What are the causes? ?This condition may be caused by: ?Physical causes, such as: ?Artery problems. This may include heart disease, high blood pressure, atherosclerosis, and diabetes. ?Hormonal problems, such as low testosterone. ?Obesity. ?Nerve problems. This may include back or pelvic injuries, multiple sclerosis, Parkinson's disease, spinal cord injury, and stroke. ?Certain medicines, such as: ?Pain relievers. ?Antidepressants. ?Blood pressure medicines and water pills (diuretics). ?Cancer medicines. ?Antihistamines. ?Muscle relaxants. ?Lifestyle factors, such as: ?Use of drugs such as marijuana, cocaine, or opioids. ?Excessive use of alcohol. ?Smoking. ?Lack of physical activity or exercise. ?Psychological causes, such as: ?Anxiety or stress. ?Sadness or depression. ?Exhaustion. ?Fear about sexual performance. ?Guilt. ?What are the signs or symptoms? ?Symptoms of this condition include: ?Inability to get an erection. ?Lack of enough hardness of the erection to allow penetration. ?Loss of the erection before sex is finished. ?Sometimes having normal erections, but with frequent unsatisfactory episodes. ?Low sexual satisfaction in either partner due to erection problems. ?A curved penis occurring with erection. The curve may cause pain, or the penis may be too curved to allow for intercourse. ?Never having nighttime or morning erections. ?How is this diagnosed? ?This condition is often diagnosed by: ?Performing a physical exam to find other diseases or specific problems with the penis. ?Asking you detailed questions about the problem. ?Doing tests,  such as: ?Blood tests to check for diabetes mellitus or high cholesterol, or to measure hormone levels. ?Other tests to check for underlying health conditions. ?An ultrasound exam to check for scarring. ?A test to check blood flow to the penis. ?Doing a sleep study at home to measure nighttime erections. ?How is this treated? ?This condition may be treated by: ?Medicines, such as: ?Medicine taken by mouth to help you achieve an erection (oral medicine). ?Hormone replacement therapy to replace low testosterone levels. ?Medicine that is injected into the penis. Your health care provider may instruct you how to give yourself these injections at home. ?Medicine that is delivered with a short applicator tube. The tube is inserted into the opening at the tip of the penis, which is the opening of the urethra. A tiny pellet of medicine is put in the urethra. The pellet dissolves and enhances erectile function. This is also called MUSE (medicated urethral system for erections) therapy. ?Vacuum pump. This is a pump with a ring on it. The pump and ring are placed on the penis and used to create pressure that helps the penis become erect. ?Penile implant surgery. In this procedure, you may receive: ?An inflatable implant. This consists of cylinders, a pump, and a reservoir. The cylinders can be inflated with a fluid that helps to create an erection, and they can be deflated after intercourse. ?A semi-rigid implant. This consists of two silicone rubber rods. The rods provide some rigidity. They are also flexible, so the penis can both curve downward in its normal position and become straight for sexual intercourse. ?Blood vessel surgery to improve blood flow to the penis. During this procedure, a blood vessel from a different part of the body is placed into the penis to allow blood to flow around (bypass) damaged or blocked blood vessels. ?Lifestyle changes,   such as exercising more, losing weight, and quitting smoking. ?Follow  these instructions at home: ?Medicines ? ?Take over-the-counter and prescription medicines only as told by your health care provider. Do not increase the dosage without first discussing it with your health care provider. ?If you are using self-injections, do injections as directed by your health care provider. Make sure you avoid any veins that are on the surface of the penis. After giving an injection, apply pressure to the injection site for 5 minutes. ?Talk to your health care provider about how to prevent headaches while taking ED medicines. These medicines may cause a sudden headache due to the increase in blood flow in your body. ?General instructions ?Exercise regularly, as directed by your health care provider. Work with your health care provider to lose weight, if needed. ?Do not use any products that contain nicotine or tobacco. These products include cigarettes, chewing tobacco, and vaping devices, such as e-cigarettes. If you need help quitting, ask your health care provider. ?Before using a vacuum pump, read the instructions that come with the pump and discuss any questions with your health care provider. ?Keep all follow-up visits. This is important. ?Contact a health care provider if: ?You feel nauseous. ?You are vomiting. ?You get sudden headaches while taking ED medicines. ?You have any concerns about your sexual health. ?Get help right away if: ?You are taking oral or injectable medicines and you have an erection that lasts longer than 4 hours. If your health care provider is unavailable, go to the nearest emergency room for evaluation. An erection that lasts much longer than 4 hours can result in permanent damage to your penis. ?You have severe pain in your groin or abdomen. ?You develop redness or severe swelling of your penis. ?You have redness spreading at your groin or lower abdomen. ?You are unable to urinate. ?You experience chest pain or a rapid heartbeat (palpitations) after taking oral  medicines. ?These symptoms may represent a serious problem that is an emergency. Do not wait to see if the symptoms will go away. Get medical help right away. Call your local emergency services (911 in the [place] Do not drive yourself to the hospital. ?Summary ?Erectile dysfunction (ED) is the inability to get or keep an erection during sexual intercourse. ?This condition is diagnosed based on a physical exam, your symptoms, and tests to determine the cause. Treatment varies depending on the cause and may include medicines, hormone therapy, surgery, or a vacuum pump. ?You may need follow-up visits to make sure that you are using your medicines or devices correctly. ?Get help right away if you are taking or injecting medicines and you have an erection that lasts longer than 4 hours. ?This information is not intended to replace advice given to you by your health care provider. Make sure you discuss any questions you have with your health care provider. ?Document Revised: 08/01/2020 Document Reviewed: 08/01/2020 ?Elsevier Patient Education ? 2023 Elsevier Inc. ? ?

## 2022-07-23 NOTE — Progress Notes (Unsigned)
07/23/2022 8:50 AM   Fernando Vance [birth year] [id number]  Referring provider: Kathyrn Drown, MD [place],  [place] [address]  Followup erectile dysfunction and premature ejaculation   HPI: Fernando Vance is a 62yo here for followup for erectile dysfunction and premature ejaculation. He notes no improvement in the firmness of his erection with tadalafil '5mg'$  daily and '20mg'$  PRN. He cannot maintain his erection. Paxil works for his premature ejaculation   PMH: Past Medical History:  Diagnosis Date   Diabetes mellitus without complication ([place])    Hypertension     Surgical History: Past Surgical History:  Procedure Laterality Date   COLONOSCOPY N/A 08/18/2016   Procedure: COLONOSCOPY;  Surgeon: Danie Binder, MD;  [facility];  Service: Endoscopy;  Laterality: N/A;  1030    POLYPECTOMY  08/18/2016   Procedure: POLYPECTOMY;  Surgeon: Danie Binder, MD;  [facility];  Service: Endoscopy;;  sigmoid   WISDOM TOOTH EXTRACTION     20 years ago    Home Medications:  Allergies as of 07/23/2022   No Known Allergies      Medication List        Accurate as of July 23, 2022  8:50 AM. If you have any questions, ask your nurse or doctor.          albuterol 108 (90 Base) MCG/ACT inhaler Commonly known as: VENTOLIN HFA SMARTSIG:1 Puff(s) Via Inhaler Every 4 Hours PRN   amLODipine 10 MG tablet Commonly known as: NORVASC Take 1 tablet (10 mg total) by mouth daily.   empagliflozin 25 MG Tabs tablet Commonly known as: Jardiance Take 1 tablet (25 mg total) by mouth daily.   ezetimibe 10 MG tablet Commonly known as: ZETIA TAKE ONE TABLET BY MOUTH ONCE DAILY.   glipiZIDE 5 MG 24 hr tablet Commonly known as: GLUCOTROL XL Take 1 tablet (5 mg total) by mouth daily with breakfast.   meloxicam 15 MG tablet Commonly known as: MOBIC Take 1 tablet (15 mg total) by mouth daily.   metFORMIN 500 MG 24 hr tablet Commonly known as:  GLUCOPHAGE-XR Take 1 tablet (500 mg total) by mouth 2 (two) times daily.   PARoxetine 10 MG tablet Commonly known as: Paxil Take 1 tablet (10 mg total) by mouth daily.   rosuvastatin 40 MG tablet Commonly known as: CRESTOR Take 1 tablet (40 mg total) by mouth daily.   tadalafil 5 MG tablet Commonly known as: CIALIS Take 1 tablet (5 mg total) by mouth daily.   tadalafil 20 MG tablet Commonly known as: CIALIS Take 1 tablet (20 mg total) by mouth daily as needed for erectile dysfunction.   valsartan-hydrochlorothiazide 80-12.5 MG tablet Commonly known as: DIOVAN-HCT Take 1 tablet by mouth daily.        Allergies: No Known Allergies  Family History: Family History  Problem Relation Age of Onset   Diabetes Mother    Heart failure Father     Social History:  reports that he has never smoked. He has never used smokeless tobacco. He reports that he does not drink alcohol and does not use drugs.  ROS: All other review of systems were reviewed and are negative except what is noted above in HPI  Physical Exam: BP (!) 147/82   Pulse 87   Wt 220 lb (99.8 kg)   BMI 32.49 kg/m   Constitutional:  Alert and oriented, No acute distress. HEENT: [place] AT, moist mucus membranes.  Trachea midline, no  masses. Cardiovascular: No clubbing, cyanosis, or edema. Respiratory: Normal respiratory effort, no increased work of breathing. GI: Abdomen is soft, nontender, nondistended, no abdominal masses GU: No CVA tenderness.  Lymph: No cervical or inguinal lymphadenopathy. Skin: No rashes, bruises or suspicious lesions. Neurologic: Grossly intact, no focal deficits, moving all 4 extremities. Psychiatric: Normal mood and affect.  Laboratory Data: Lab Results  Component Value Date   WBC 5.3 08/16/2019   HGB 14.1 08/16/2019   HCT 45.6 08/16/2019   MCV 73 (L) 08/16/2019   PLT 342 08/16/2019    Lab Results  Component Value Date   CREATININE 1.14 04/16/2022    No results found for:  "PSA"  No results found for: "TESTOSTERONE"  Lab Results  Component Value Date   HGBA1C 8.1 (H) 04/16/2022    Urinalysis    Component Value Date/Time   APPEARANCEUR Clear 07/02/2021 1433   GLUCOSEU 3+ (A) 07/02/2021 1433   BILIRUBINUR Negative 07/02/2021 1433   PROTEINUR 1+ (A) 07/02/2021 1433   NITRITE Negative 07/02/2021 1433   LEUKOCYTESUR Negative 07/02/2021 1433    Lab Results  Component Value Date   LABMICR 61.9 04/16/2022   WBCUA None seen 07/02/2021   LABEPIT None seen 07/02/2021   MUCUS Present 07/02/2021   BACTERIA Few 07/02/2021    Pertinent Imaging: *** No results found for this or any previous visit.  No results found for this or any previous visit.  No results found for this or any previous visit.  No results found for this or any previous visit.  No results found for this or any previous visit.  No valid procedures specified. No results found for this or any previous visit.  No results found for this or any previous visit.   Assessment & Plan:    1. Erectile dysfunction, unspecified erectile dysfunction type ***  2. Premature ejaculation ***   No follow-ups on file.  Nicolette Bang, MD  [facility] [place]

## 2022-08-21 ENCOUNTER — Ambulatory Visit: Payer: BC Managed Care – PPO | Admitting: Family Medicine

## 2022-09-12 DIAGNOSIS — E118 Type 2 diabetes mellitus with unspecified complications: Secondary | ICD-10-CM | POA: Diagnosis not present

## 2022-09-12 DIAGNOSIS — I10 Essential (primary) hypertension: Secondary | ICD-10-CM | POA: Diagnosis not present

## 2022-09-13 LAB — BASIC METABOLIC PANEL
BUN/Creatinine Ratio: 19 (ref 10–24)
BUN: 20 mg/dL (ref 8–27)
CO2: 24 mmol/L (ref 20–29)
Calcium: 9.6 mg/dL (ref 8.6–10.2)
Chloride: 103 mmol/L (ref 96–106)
Creatinine, Ser: 1.04 mg/dL (ref 0.76–1.27)
Glucose: 124 mg/dL — ABNORMAL HIGH (ref 70–99)
Potassium: 5.2 mmol/L (ref 3.5–5.2)
Sodium: 143 mmol/L (ref 134–144)
eGFR: 82 mL/min/{1.73_m2} (ref >59)

## 2022-09-13 LAB — HEMOGLOBIN A1C
Est. average glucose Bld gHb Est-mCnc: 171 mg/dL
Hgb A1c MFr Bld: 7.6 % — ABNORMAL HIGH (ref 4.8–5.6)

## 2022-09-18 ENCOUNTER — Encounter: Payer: Self-pay | Admitting: Family Medicine

## 2022-09-18 ENCOUNTER — Ambulatory Visit: Payer: BC Managed Care – PPO | Admitting: Family Medicine

## 2022-09-18 VITALS — BP 136/88 | HR 92 | Ht 69.0 in | Wt 225.8 lb

## 2022-09-18 DIAGNOSIS — E785 Hyperlipidemia, unspecified: Secondary | ICD-10-CM

## 2022-09-18 DIAGNOSIS — I10 Essential (primary) hypertension: Secondary | ICD-10-CM | POA: Diagnosis not present

## 2022-09-18 DIAGNOSIS — E1169 Type 2 diabetes mellitus with other specified complication: Secondary | ICD-10-CM

## 2022-09-18 DIAGNOSIS — Z79899 Other long term (current) drug therapy: Secondary | ICD-10-CM

## 2022-09-18 DIAGNOSIS — Z125 Encounter for screening for malignant neoplasm of prostate: Secondary | ICD-10-CM

## 2022-09-18 DIAGNOSIS — Z7984 Long term (current) use of oral hypoglycemic drugs: Secondary | ICD-10-CM

## 2022-09-18 DIAGNOSIS — E118 Type 2 diabetes mellitus with unspecified complications: Secondary | ICD-10-CM

## 2022-09-18 MED ORDER — EMPAGLIFLOZIN 25 MG PO TABS: 25.0000 mg | ORAL_TABLET | Freq: Every day | ORAL | 1 refills | Status: DC

## 2022-09-18 MED ORDER — ROSUVASTATIN CALCIUM 40 MG PO TABS: 40.0000 mg | ORAL_TABLET | Freq: Every day | ORAL | 1 refills | Status: DC

## 2022-09-18 MED ORDER — GLIPIZIDE ER 5 MG PO TB24: 5.0000 mg | ORAL_TABLET | Freq: Every day | ORAL | 1 refills | Status: DC

## 2022-09-18 MED ORDER — METFORMIN HCL ER 500 MG PO TB24: ORAL_TABLET | ORAL | 1 refills | Status: DC

## 2022-09-18 MED ORDER — EZETIMIBE 10 MG PO TABS: 10.0000 mg | ORAL_TABLET | Freq: Every day | ORAL | 2 refills | Status: DC

## 2022-09-18 MED ORDER — VALSARTAN-HYDROCHLOROTHIAZIDE 80-12.5 MG PO TABS: 1.0000 | ORAL_TABLET | Freq: Every day | ORAL | 1 refills | Status: DC

## 2022-09-18 MED ORDER — AMLODIPINE BESYLATE 10 MG PO TABS: 10.0000 mg | ORAL_TABLET | Freq: Every day | ORAL | 1 refills | Status: DC

## 2022-09-18 NOTE — Progress Notes (Signed)
Subjective:    Patient ID: Fernando Vance, male    DOB: November 18, 1960, 62 y.o.   MRN: [id number]  HPI Patient arrives today for 3 month follow up. Patient states no concerns or issues today. Hyperlipidemia associated with type 2 diabetes mellitus (HCC) - Plan: Lipid panel  HTN (hypertension), benign  Controlled diabetes mellitus type 2 with complications, unspecified whether long term insulin use (HCC) - Plan: Hemoglobin A1c, Basic Metabolic Panel, Microalbumin/Creatinine Ratio, Urine  Screening for prostate cancer - Plan: PSA  Encounter for long-term current use of high risk medication - Plan: Hepatic Function Panel  Results for orders placed or performed in visit on 06/18/22  HM DIABETES EYE EXAM  Result Value Ref Range   HM Diabetic Eye Exam No Retinopathy No Retinopathy    Outpatient Encounter Medications as of 09/18/2022  Medication Sig   albuterol (VENTOLIN HFA) 108 (90 Base) MCG/ACT inhaler SMARTSIG:1 Puff(s) Via Inhaler Every 4 Hours PRN   meloxicam (MOBIC) 15 MG tablet Take 1 tablet (15 mg total) by mouth daily.   PARoxetine (PAXIL) 10 MG tablet Take 1 tablet (10 mg total) by mouth daily.   tadalafil (CIALIS) 20 MG tablet Take 1 tablet (20 mg total) by mouth daily as needed for erectile dysfunction.   tadalafil (CIALIS) 5 MG tablet Take 1 tablet (5 mg total) by mouth daily.   vardenafil (LEVITRA) 20 MG tablet Take 1 tablet (20 mg total) by mouth daily as needed for erectile dysfunction.   [DISCONTINUED] amLODipine (NORVASC) 10 MG tablet Take 1 tablet (10 mg total) by mouth daily.   [DISCONTINUED] empagliflozin (JARDIANCE) 25 MG TABS tablet Take 1 tablet (25 mg total) by mouth daily.   [DISCONTINUED] ezetimibe (ZETIA) 10 MG tablet TAKE ONE TABLET BY MOUTH ONCE DAILY.   [DISCONTINUED] glipiZIDE (GLUCOTROL XL) 5 MG 24 hr tablet Take 1 tablet (5 mg total) by mouth daily with breakfast.   [DISCONTINUED] metFORMIN (GLUCOPHAGE-XR) 500 MG 24 hr tablet Take 1 tablet (500 mg total)  by mouth 2 (two) times daily.   [DISCONTINUED] rosuvastatin (CRESTOR) 40 MG tablet Take 1 tablet (40 mg total) by mouth daily.   [DISCONTINUED] valsartan-hydrochlorothiazide (DIOVAN-HCT) 80-12.5 MG tablet Take 1 tablet by mouth daily.   amLODipine (NORVASC) 10 MG tablet Take 1 tablet (10 mg total) by mouth daily.   empagliflozin (JARDIANCE) 25 MG TABS tablet Take 1 tablet (25 mg total) by mouth daily.   ezetimibe (ZETIA) 10 MG tablet Take 1 tablet (10 mg total) by mouth daily.   glipiZIDE (GLUCOTROL XL) 5 MG 24 hr tablet Take 1 tablet (5 mg total) by mouth daily with breakfast.   metFORMIN (GLUCOPHAGE-XR) 500 MG 24 hr tablet 1 in the morning and 2 in the evening   rosuvastatin (CRESTOR) 40 MG tablet Take 1 tablet (40 mg total) by mouth daily.   valsartan-hydrochlorothiazide (DIOVAN-HCT) 80-12.5 MG tablet Take 1 tablet by mouth daily.   No facility-administered encounter medications on file as of 09/18/2022.     Review of Systems     Objective:   Physical Exam  General-in no acute distress Eyes-no discharge Lungs-respiratory rate normal, CTA CV-no murmurs,RRR Extremities skin warm dry no edema Neuro grossly normal Behavior normal, alert  Patient's GFR looks good creatinine looks good, A1c is 7.6 which is an improvement     Assessment & Plan:  1. Hyperlipidemia associated with type 2 diabetes mellitus (HCC) Hyperlipidemia-importance of diet, weight control, activity, compliance with medications discussed.   Recent labs reviewed.   Any additional  labs or refills ordered.   Importance of keeping under good control discussed. Regular follow-up visits discussed With - Lipid panel  2. HTN (hypertension), benign HTN- patient seen for follow-up regarding HTN.   Diet, medication compliance, appropriate labs and refills were completed.   Importance of keeping blood pressure under good control to lessen the risk of complications discussed Regular follow-up visits discussed Blood  pressure borderline encouraged him to try to fit more walking and healthy diet printout given  3. Controlled diabetes mellitus type 2 with complications, unspecified whether long term insulin use (HCC) The patient was seen today as part of a comprehensive visit for diabetes. The importance of keeping her A1c at or below 7 range was discussed.  Discussed diet, activity, and medication compliance Emphasized healthy eating primarily with vegetables fruits and if utilizing meats lean meats such as chicken or fish grilled baked broiled Avoid sugary drinks Minimize and avoid processed foods Fit in regular physical activity preferably 25 to 30 minutes 4 times per week Standard follow-up visit recommended.  Patient aware lack of control and follow-up increases risk of diabetic complications. Regular follow-up visits Yearly ophthalmology Yearly foot exam Increase metformin to 3/day if GI side effects to let us know Healthy diet printout given Activity printout given - Hemoglobin A1c - Basic Metabolic Panel - Microalbumin/Creatinine Ratio, Urine  4. Screening for prostate cancer Screening - PSA  5. Encounter for long-term current use of high risk medication Screening - Hepatic Function Panel

## 2022-10-15 ENCOUNTER — Ambulatory Visit: Payer: BC Managed Care – PPO | Admitting: Family Medicine

## 2022-10-29 ENCOUNTER — Ambulatory Visit: Payer: BC Managed Care – PPO | Admitting: Urology

## 2022-12-05 ENCOUNTER — Ambulatory Visit (INDEPENDENT_AMBULATORY_CARE_PROVIDER_SITE_OTHER): Payer: BC Managed Care – PPO | Admitting: Urology

## 2022-12-05 VITALS — BP 144/80 | HR 94

## 2022-12-05 DIAGNOSIS — N529 Male erectile dysfunction, unspecified: Secondary | ICD-10-CM | POA: Diagnosis not present

## 2022-12-05 DIAGNOSIS — F524 Premature ejaculation: Secondary | ICD-10-CM

## 2022-12-05 MED ORDER — AMBULATORY NON FORMULARY MEDICATION: 0.2000 mL | 5 refills | Status: AC | PRN

## 2022-12-05 NOTE — Progress Notes (Unsigned)
12/05/2022 10:42 AM   Fernando Vance 1960-10-17 [id number]  Referring provider: Babs Sciara, MD [address] [place],  [place] [address]  No chief complaint on file.   HPI: He gets a semifirm erection with levitra 20mg  prn. He takes paxil for premature ejaculation which works well.    PMH: Past Medical History:  Diagnosis Date   Diabetes mellitus without complication (HCC)    Hypertension     Surgical History: Past Surgical History:  Procedure Laterality Date   COLONOSCOPY N/A 08/18/2016   Procedure: COLONOSCOPY;  Surgeon: West Bali, MD;  [facility];  Service: Endoscopy;  Laterality: N/A;  1030    POLYPECTOMY  08/18/2016   Procedure: POLYPECTOMY;  Surgeon: West Bali, MD;  [facility];  Service: Endoscopy;;  sigmoid   WISDOM TOOTH EXTRACTION     20 years ago    Home Medications:  Allergies as of 12/05/2022   No Known Allergies      Medication List        Accurate as of December 05, 2022 10:42 AM. If you have any questions, ask your nurse or doctor.          albuterol 108 (90 Base) MCG/ACT inhaler Commonly known as: VENTOLIN HFA SMARTSIG:1 Puff(s) Via Inhaler Every 4 Hours PRN   amLODipine 10 MG tablet Commonly known as: NORVASC Take 1 tablet (10 mg total) by mouth daily.   empagliflozin 25 MG Tabs tablet Commonly known as: Jardiance Take 1 tablet (25 mg total) by mouth daily.   ezetimibe 10 MG tablet Commonly known as: ZETIA Take 1 tablet (10 mg total) by mouth daily.   glipiZIDE 5 MG 24 hr tablet Commonly known as: GLUCOTROL XL Take 1 tablet (5 mg total) by mouth daily with breakfast.   meloxicam 15 MG tablet Commonly known as: MOBIC Take 1 tablet (15 mg total) by mouth daily.   metFORMIN 500 MG 24 hr tablet Commonly known as: GLUCOPHAGE-XR 1 in the morning and 2 in the evening   PARoxetine 10 MG tablet Commonly known as: Paxil Take 1 tablet (10 mg total) by mouth daily.   rosuvastatin 40  MG tablet Commonly known as: CRESTOR Take 1 tablet (40 mg total) by mouth daily.   tadalafil 5 MG tablet Commonly known as: CIALIS Take 1 tablet (5 mg total) by mouth daily.   tadalafil 20 MG tablet Commonly known as: CIALIS Take 1 tablet (20 mg total) by mouth daily as needed for erectile dysfunction.   valsartan-hydrochlorothiazide 80-12.5 MG tablet Commonly known as: DIOVAN-HCT Take 1 tablet by mouth daily.   vardenafil 20 MG tablet Commonly known as: LEVITRA Take 1 tablet (20 mg total) by mouth daily as needed for erectile dysfunction.        Allergies: No Known Allergies  Family History: Family History  Problem Relation Age of Onset   Diabetes Mother    Heart failure Father     Social History:  reports that he has never smoked. He has never used smokeless tobacco. He reports that he does not drink alcohol and does not use drugs.  ROS: All other review of systems were reviewed and are negative except what is noted above in HPI  Physical Exam: BP (!) 152/83   Pulse 97   Constitutional:  Alert and oriented, No acute distress. HEENT: [place] AT, moist mucus membranes.  Trachea midline, no masses. Cardiovascular: No clubbing, cyanosis, or edema. Respiratory: Normal respiratory effort, no increased work  of breathing. GI: Abdomen is soft, nontender, nondistended, no abdominal masses GU: No CVA tenderness.  Lymph: No cervical or inguinal lymphadenopathy. Skin: No rashes, bruises or suspicious lesions. Neurologic: Grossly intact, no focal deficits, moving all 4 extremities. Psychiatric: Normal mood and affect.  Laboratory Data: Lab Results  Component Value Date   WBC 5.3 08/16/2019   HGB 14.1 08/16/2019   HCT 45.6 08/16/2019   MCV 73 (L) 08/16/2019   PLT 342 08/16/2019    Lab Results  Component Value Date   CREATININE 1.04 09/12/2022    No results found for: "PSA"  No results found for: "TESTOSTERONE"  Lab Results  Component Value Date   HGBA1C 7.6 (H)  09/12/2022    Urinalysis    Component Value Date/Time   APPEARANCEUR Clear 07/02/2021 1433   GLUCOSEU 3+ (A) 07/02/2021 1433   BILIRUBINUR Negative 07/02/2021 1433   PROTEINUR 1+ (A) 07/02/2021 1433   NITRITE Negative 07/02/2021 1433   LEUKOCYTESUR Negative 07/02/2021 1433    Lab Results  Component Value Date   LABMICR 61.9 04/16/2022   WBCUA None seen 07/02/2021   LABEPIT None seen 07/02/2021   MUCUS Present 07/02/2021   BACTERIA Few 07/02/2021    Pertinent Imaging: *** No results found for this or any previous visit.  No results found for this or any previous visit.  No results found for this or any previous visit.  No results found for this or any previous visit.  No results found for this or any previous visit.  No valid procedures specified. No results found for this or any previous visit.  No results found for this or any previous visit.   Assessment & Plan:    1. Erectile dysfunction, unspecified erectile dysfunction type We will trial trimix.  2. Premature ejaculation ***   No follow-ups on file.  Wilkie Aye, MD  [facility] [place]

## 2022-12-09 ENCOUNTER — Encounter: Payer: Self-pay | Admitting: Urology

## 2022-12-09 NOTE — Patient Instructions (Signed)

## 2023-01-21 ENCOUNTER — Ambulatory Visit: Payer: BC Managed Care – PPO | Admitting: Urology

## 2023-01-30 ENCOUNTER — Encounter: Payer: Self-pay | Admitting: Urology

## 2023-01-30 ENCOUNTER — Ambulatory Visit: Payer: BC Managed Care – PPO | Admitting: Urology

## 2023-01-30 VITALS — BP 162/78 | HR 96

## 2023-01-30 DIAGNOSIS — N529 Male erectile dysfunction, unspecified: Secondary | ICD-10-CM | POA: Diagnosis not present

## 2023-01-30 NOTE — Progress Notes (Unsigned)
01/30/2023 10:11 AM   Fernando Vance 1960/09/07 [id number]  Referring provider: Babs Sciara, MD [address] [place],  [place] [address]  Erectile dysfunction   HPI: Fernando Vance is a 62yo here for trimix teaching.    PMH: Past Medical History:  Diagnosis Date   Diabetes mellitus without complication (HCC)    Hypertension     Surgical History: Past Surgical History:  Procedure Laterality Date   COLONOSCOPY N/A 08/18/2016   Procedure: COLONOSCOPY;  Surgeon: West Bali, MD;  [facility];  Service: Endoscopy;  Laterality: N/A;  1030    POLYPECTOMY  08/18/2016   Procedure: POLYPECTOMY;  Surgeon: West Bali, MD;  [facility];  Service: Endoscopy;;  sigmoid   WISDOM TOOTH EXTRACTION     20 years ago    Home Medications:  Allergies as of 01/30/2023   No Known Allergies      Medication List        Accurate as of January 30, 2023 10:11 AM. If you have any questions, ask your nurse or doctor.          albuterol 108 (90 Base) MCG/ACT inhaler Commonly known as: VENTOLIN HFA SMARTSIG:1 Puff(s) Via Inhaler Every 4 Hours PRN   AMBULATORY NON FORMULARY MEDICATION 0.2 mLs by Intracavernosal route as needed. Medication Name: Trimix  PGE Pap 30mg  Phent 1mg    amLODipine 10 MG tablet Commonly known as: NORVASC Take 1 tablet (10 mg total) by mouth daily.   empagliflozin 25 MG Tabs tablet Commonly known as: Jardiance Take 1 tablet (25 mg total) by mouth daily.   ezetimibe 10 MG tablet Commonly known as: ZETIA Take 1 tablet (10 mg total) by mouth daily.   glipiZIDE 5 MG 24 hr tablet Commonly known as: GLUCOTROL XL Take 1 tablet (5 mg total) by mouth daily with breakfast.   meloxicam 15 MG tablet Commonly known as: MOBIC Take 1 tablet (15 mg total) by mouth daily.   metFORMIN 500 MG 24 hr tablet Commonly known as: GLUCOPHAGE-XR 1 in the morning and 2 in the evening   PARoxetine 10 MG tablet Commonly  known as: Paxil Take 1 tablet (10 mg total) by mouth daily.   rosuvastatin 40 MG tablet Commonly known as: CRESTOR Take 1 tablet (40 mg total) by mouth daily.   tadalafil 5 MG tablet Commonly known as: CIALIS Take 1 tablet (5 mg total) by mouth daily.   tadalafil 20 MG tablet Commonly known as: CIALIS Take 1 tablet (20 mg total) by mouth daily as needed for erectile dysfunction.   valsartan-hydrochlorothiazide 80-12.5 MG tablet Commonly known as: DIOVAN-HCT Take 1 tablet by mouth daily.   vardenafil 20 MG tablet Commonly known as: LEVITRA Take 1 tablet (20 mg total) by mouth daily as needed for erectile dysfunction.        Allergies: No Known Allergies  Family History: Family History  Problem Relation Age of Onset   Diabetes Mother    Heart failure Father     Social History:  reports that he has never smoked. He has never used smokeless tobacco. He reports that he does not drink alcohol and does not use drugs.  ROS: All other review of systems were reviewed and are negative except what is noted above in HPI  Physical Exam: BP (!) 162/78   Pulse 96   Constitutional:  Alert and oriented, No acute distress. HEENT: [place] AT, moist mucus membranes.  Trachea midline, no masses. Cardiovascular: No  clubbing, cyanosis, or edema. Respiratory: Normal respiratory effort, no increased work of breathing. GI: Abdomen is soft, nontender, nondistended, no abdominal masses GU: No CVA tenderness.  Lymph: No cervical or inguinal lymphadenopathy. Skin: No rashes, bruises or suspicious lesions. Neurologic: Grossly intact, no focal deficits, moving all 4 extremities. Psychiatric: Normal mood and affect.  Laboratory Data: Lab Results  Component Value Date   WBC 5.3 08/16/2019   HGB 14.1 08/16/2019   HCT 45.6 08/16/2019   MCV 73 (L) 08/16/2019   PLT 342 08/16/2019    Lab Results  Component Value Date   CREATININE 1.04 09/12/2022    No results found for: "PSA"  No results  found for: "TESTOSTERONE"  Lab Results  Component Value Date   HGBA1C 7.6 (H) 09/12/2022    Urinalysis    Component Value Date/Time   APPEARANCEUR Clear 07/02/2021 1433   GLUCOSEU 3+ (A) 07/02/2021 1433   BILIRUBINUR Negative 07/02/2021 1433   PROTEINUR 1+ (A) 07/02/2021 1433   NITRITE Negative 07/02/2021 1433   LEUKOCYTESUR Negative 07/02/2021 1433    Lab Results  Component Value Date   LABMICR 61.9 04/16/2022   WBCUA None seen 07/02/2021   LABEPIT None seen 07/02/2021   MUCUS Present 07/02/2021   BACTERIA Few 07/02/2021    Pertinent Imaging: *** No results found for this or any previous visit.  No results found for this or any previous visit.  No results found for this or any previous visit.  No results found for this or any previous visit.  No results found for this or any previous visit.  No valid procedures specified. No results found for this or any previous visit.  No results found for this or any previous visit.   Assessment & Plan:    1. Erectile dysfunction, unspecified erectile dysfunction type ***   No follow-ups on file.  Wilkie Aye, MD  [facility] [place]

## 2023-02-03 ENCOUNTER — Encounter: Payer: Self-pay | Admitting: Urology

## 2023-02-03 NOTE — Patient Instructions (Signed)

## 2023-03-19 DIAGNOSIS — Z79899 Other long term (current) drug therapy: Secondary | ICD-10-CM | POA: Diagnosis not present

## 2023-03-19 DIAGNOSIS — Z125 Encounter for screening for malignant neoplasm of prostate: Secondary | ICD-10-CM | POA: Diagnosis not present

## 2023-03-19 DIAGNOSIS — E785 Hyperlipidemia, unspecified: Secondary | ICD-10-CM | POA: Diagnosis not present

## 2023-03-19 DIAGNOSIS — E1169 Type 2 diabetes mellitus with other specified complication: Secondary | ICD-10-CM | POA: Diagnosis not present

## 2023-03-19 DIAGNOSIS — E118 Type 2 diabetes mellitus with unspecified complications: Secondary | ICD-10-CM | POA: Diagnosis not present

## 2023-03-20 LAB — BASIC METABOLIC PANEL
BUN/Creatinine Ratio: 13 (ref 10–24)
BUN: 16 mg/dL (ref 8–27)
CO2: 24 mmol/L (ref 20–29)
Calcium: 9.5 mg/dL (ref 8.6–10.2)
Chloride: 101 mmol/L (ref 96–106)
Creatinine, Ser: 1.23 mg/dL (ref 0.76–1.27)
Glucose: 154 mg/dL — ABNORMAL HIGH (ref 70–99)
Potassium: 4.3 mmol/L (ref 3.5–5.2)
Sodium: 141 mmol/L (ref 134–144)
eGFR: 67 mL/min/{1.73_m2} (ref >59)

## 2023-03-20 LAB — LIPID PANEL
Chol/HDL Ratio: 4 {ratio} (ref 0.0–5.0)
Cholesterol, Total: 195 mg/dL (ref 100–199)
HDL: 49 mg/dL (ref >39)
LDL Chol Calc (NIH): 114 mg/dL — ABNORMAL HIGH (ref 0–99)
Triglycerides: 181 mg/dL — ABNORMAL HIGH (ref 0–149)
VLDL Cholesterol Cal: 32 mg/dL (ref 5–40)

## 2023-03-20 LAB — HEPATIC FUNCTION PANEL
ALT: 22 [IU]/L (ref 0–44)
AST: 20 [IU]/L (ref 0–40)
Albumin: 4.9 g/dL (ref 3.8–4.9)
Alkaline Phosphatase: 90 [IU]/L (ref 44–121)
Bilirubin Total: 0.9 mg/dL (ref 0.0–1.2)
Bilirubin, Direct: 0.19 mg/dL (ref 0.00–0.40)
Total Protein: 7.7 g/dL (ref 6.0–8.5)

## 2023-03-20 LAB — HEMOGLOBIN A1C
Est. average glucose Bld gHb Est-mCnc: 194 mg/dL
Hgb A1c MFr Bld: 8.4 % — ABNORMAL HIGH (ref 4.8–5.6)

## 2023-03-20 LAB — MICROALBUMIN / CREATININE URINE RATIO
Creatinine, Urine: 99.9 mg/dL
Microalb/Creat Ratio: 75 mg/g{creat} — ABNORMAL HIGH (ref 0–29)
Microalbumin, Urine: 75.4 ug/mL

## 2023-03-20 LAB — PSA: Prostate Specific Ag, Serum: 0.6 ng/mL (ref 0.0–4.0)

## 2023-03-23 ENCOUNTER — Encounter: Payer: Self-pay | Admitting: Family Medicine

## 2023-03-23 ENCOUNTER — Ambulatory Visit: Payer: BC Managed Care – PPO | Admitting: Family Medicine

## 2023-03-23 VITALS — BP 122/72 | HR 95 | Temp 99.9°F | Ht 69.0 in | Wt 225.6 lb

## 2023-03-23 DIAGNOSIS — E1169 Type 2 diabetes mellitus with other specified complication: Secondary | ICD-10-CM | POA: Diagnosis not present

## 2023-03-23 DIAGNOSIS — E1165 Type 2 diabetes mellitus with hyperglycemia: Secondary | ICD-10-CM

## 2023-03-23 DIAGNOSIS — Z23 Encounter for immunization: Secondary | ICD-10-CM

## 2023-03-23 DIAGNOSIS — I10 Essential (primary) hypertension: Secondary | ICD-10-CM

## 2023-03-23 DIAGNOSIS — N529 Male erectile dysfunction, unspecified: Secondary | ICD-10-CM | POA: Diagnosis not present

## 2023-03-23 DIAGNOSIS — E785 Hyperlipidemia, unspecified: Secondary | ICD-10-CM

## 2023-03-23 MED ORDER — MELOXICAM 15 MG PO TABS: 15.0000 mg | ORAL_TABLET | Freq: Every day | ORAL | 0 refills | Status: DC

## 2023-03-23 MED ORDER — EZETIMIBE 10 MG PO TABS: 10.0000 mg | ORAL_TABLET | Freq: Every day | ORAL | 2 refills | Status: DC

## 2023-03-23 MED ORDER — EMPAGLIFLOZIN 25 MG PO TABS: 25.0000 mg | ORAL_TABLET | Freq: Every day | ORAL | 1 refills | Status: DC

## 2023-03-23 MED ORDER — VALSARTAN-HYDROCHLOROTHIAZIDE 80-12.5 MG PO TABS: 1.0000 | ORAL_TABLET | Freq: Every day | ORAL | 1 refills | Status: DC

## 2023-03-23 MED ORDER — TIRZEPATIDE 2.5 MG/0.5ML ~~LOC~~ SOAJ: 2.5000 mg | SUBCUTANEOUS | 2 refills | Status: DC

## 2023-03-23 MED ORDER — PAROXETINE HCL 10 MG PO TABS: 10.0000 mg | ORAL_TABLET | Freq: Every day | ORAL | 11 refills | Status: DC

## 2023-03-23 MED ORDER — TADALAFIL 20 MG PO TABS
20.0000 mg | ORAL_TABLET | Freq: Every day | ORAL | 5 refills | Status: AC | PRN
Start: 2023-03-23 — End: ?

## 2023-03-23 MED ORDER — METFORMIN HCL ER 500 MG PO TB24: ORAL_TABLET | ORAL | 1 refills | Status: DC

## 2023-03-23 MED ORDER — GLIPIZIDE ER 5 MG PO TB24: 5.0000 mg | ORAL_TABLET | Freq: Every day | ORAL | 1 refills | Status: DC

## 2023-03-23 MED ORDER — AMLODIPINE BESYLATE 10 MG PO TABS: 10.0000 mg | ORAL_TABLET | Freq: Every day | ORAL | 1 refills | Status: DC

## 2023-03-23 MED ORDER — ROSUVASTATIN CALCIUM 40 MG PO TABS: 40.0000 mg | ORAL_TABLET | Freq: Every day | ORAL | 1 refills | Status: DC

## 2023-03-23 NOTE — Patient Instructions (Addendum)
We are initiating Mounjaro  2.5 mg once a week is the starting dose You can expect some nausea with the medicine initially but most people after using it for 2 to 3 weeks we will see less nausea.  It would be wise to try to space your meals throughout the day rather than eating 1 big meal at 1 time  For now continue metformin and glipizide and Jardiance  Occasionally check your sugars if you are having any low sugars below 90 I would recommend stopping glipizide  Please let us know how things are going after using the medicine for 3 weeks then we can decide if we need to go up on the dose  I would recommend doing a follow-up lab work and follow-up office visit in approximately 4 months  Please take care-Dr. Lorin Picket  Should you feel you are having any questions concerns or problems please let us know If the medication causes you to have significant abdominal pain or frequent vomiting please let us know.  We would recommend stopping the medicine if that occurs.  Hey Robin question phoria in regards to pharmacy if you still lose [facility] okay we will send it there I will also do you know if for some reason

## 2023-03-23 NOTE — Progress Notes (Signed)
Subjective:    Patient ID: Fernando Vance, male    DOB: 09/29/1960, 62 y.o.   MRN: [id number]  Discussed the use of AI scribe software for clinical note transcription with the patient, who gave verbal consent to proceed.  History of Present Illness   The patient, a busy individual working six days a week, reports no significant health issues. However, he expresses concern about his energy levels. The patient's A1c levels have been fluctuating over the past year, with the most recent reading at 8.4, indicating poorly controlled diabetes. The patient admits to consuming sweet tea, which he acknowledges may be contributing to his elevated A1c levels.  The patient is currently on a regimen of metformin and glipizide for diabetes management. He is open to trying new medications to improve his glycemic control and is willing to tolerate potential side effects such as nausea. The patient is also diligent about monitoring his blood glucose levels at home.  In addition to diabetes, the patient has a history of prostate issues. He is currently using a shot for treatment, the name of which he could not recall during the consultation. The patient reports the treatment as being only somewhat effective. This is for erectile dysfunction. Not using Cialis  The patient's most recent blood work showed an LDL level of 114, which is higher than the desired level of 70 or below. The patient is currently on rosuvastatin for cholesterol management. The patient's creatinine and GFR levels are satisfactory, and a small amount of protein in the urine is being managed with Jardiance. The patient's blood pressure reading during the consultation was 122/72, which is within the normal range.         Review of Systems     Objective:    Physical Exam   VITALS: BP- 122/72 CHEST: Lungs clear. CARDIOVASCULAR: Heart sounds normal. Pulses good.     General-in no acute distress Eyes-no discharge Lungs-respiratory rate  normal, CTA CV-no murmurs,RRR Extremities skin warm dry no edema Neuro grossly normal Behavior normal, alert       Assessment & Plan:  Assessment and Plan    Type 2 Diabetes Mellitus A1c increased to 8.4 from 7.6, indicating suboptimal control. Patient admits to frequent consumption of sweet tea. Discussed the importance of diet control and the potential benefit of adding a GLP-1 receptor agonist, Mounjaro, to his current regimen (Metformin, Glipizide, Jardiance). -Start [place], a once-weekly injection, and monitor for potential side effects such as nausea. -Continue Metformin, Glipizide, and Jardiance. Monitor blood glucose levels and consider discontinuing Glipizide if hypoglycemia occurs. -Encourage patient to reduce consumption of sweet tea and maintain a healthy diet.  Hyperlipidemia LDL level at 114, goal is under 70. Patient is currently on Rosuvastatin. -Continue Rosuvastatin and encourage adherence to a heart-healthy diet. -Hope to see improvement in LDL levels with the addition of Mounjaro.  Proteinuria Small amount of protein in the urine, currently managed with Jardiance. -Continue Jardiance to maintain low proteinuria levels.  General Health Maintenance -Administer influenza vaccine today. -Schedule colonoscopy as patient is due for screening. -Plan for follow-up visit in March 2025 with blood work prior to the visit.     1. Erectile dysfunction, unspecified erectile dysfunction type Under the care of of urology - tadalafil (CIALIS) 20 MG tablet; Take 1 tablet (20 mg total) by mouth daily as needed for erectile dysfunction.  Dispense: 10 tablet; Refill: 5  2. Immunization due Today - Flu vaccine trivalent PF, 6mos and older(Flulaval,Afluria,Fluarix,Fluzone)  3. HTN (hypertension), benign  Good control continue current measures  4. Hyperlipidemia associated with type 2 diabetes mellitus (HCC) LDL needs to be below 70 continue current measures healthy diet  repeat this again in 4 months  5. Poorly controlled type 2 diabetes mellitus (HCC) Add Mounjaro side effects discussed patient to give [place] feedback patient to follow-up in 4 months follow-up sooner if any problems  Patient to give [place] feedback if any negative symptoms with [facility] and will help [place] in guiding him regarding the dosing

## 2023-03-24 ENCOUNTER — Other Ambulatory Visit: Payer: Self-pay

## 2023-03-24 DIAGNOSIS — E1165 Type 2 diabetes mellitus with hyperglycemia: Secondary | ICD-10-CM

## 2023-03-24 DIAGNOSIS — E1169 Type 2 diabetes mellitus with other specified complication: Secondary | ICD-10-CM

## 2023-03-24 DIAGNOSIS — Z79899 Other long term (current) drug therapy: Secondary | ICD-10-CM

## 2023-03-24 DIAGNOSIS — I10 Essential (primary) hypertension: Secondary | ICD-10-CM

## 2023-05-22 ENCOUNTER — Telehealth: Payer: Self-pay | Admitting: *Deleted

## 2023-05-22 ENCOUNTER — Telehealth: Payer: Self-pay | Admitting: Pharmacist

## 2023-05-22 NOTE — Telephone Encounter (Signed)
 Paper faxed to [employer] for [place] [place]

## 2023-05-22 NOTE — Telephone Encounter (Signed)
Copied from CRM [id number]. Topic: Clinical - Prescription Issue >> May 22, 2023  1:23 PM Alvino Blood C wrote: Reason for CRM: [employer] is needing to initiate a prior auth for the following medication: tirzepatide [facility]) 2.5 MG/0.5ML Pen

## 2023-05-22 NOTE — Telephone Encounter (Signed)
 PA submitted - await response from insurance

## 2023-06-11 NOTE — Telephone Encounter (Signed)
Patient FINALLY approved until feb 28th  For mounjaro 2.5mg 

## 2023-06-11 NOTE — Telephone Encounter (Signed)
Wife called and stated patient was able to pick up medication

## 2023-06-25 ENCOUNTER — Telehealth: Payer: Self-pay | Admitting: Family Medicine

## 2023-06-25 NOTE — Telephone Encounter (Signed)
 FYI-Patient is wanting you to know that new medication is doing well.

## 2023-06-29 NOTE — Telephone Encounter (Signed)
 Glad to hear the Mounjaro is going well Continue current measures Do lab work before follow-up visit  Has office visit March 6 8:20 AM Try to do lab work a few days before that thank you

## 2023-06-30 NOTE — Telephone Encounter (Signed)
Left message to return call to notify patient of provider's recommendations below

## 2023-07-01 NOTE — Telephone Encounter (Signed)
Attempted to call pt to inform of providers recommendations, could not leave VM

## 2023-07-01 NOTE — Telephone Encounter (Signed)
Dr Gerda Diss comments discussed with Pt, advised per Dr Gerda Diss

## 2023-07-17 DIAGNOSIS — E1169 Type 2 diabetes mellitus with other specified complication: Secondary | ICD-10-CM | POA: Diagnosis not present

## 2023-07-17 DIAGNOSIS — I10 Essential (primary) hypertension: Secondary | ICD-10-CM | POA: Diagnosis not present

## 2023-07-17 DIAGNOSIS — E785 Hyperlipidemia, unspecified: Secondary | ICD-10-CM | POA: Diagnosis not present

## 2023-07-17 DIAGNOSIS — E1165 Type 2 diabetes mellitus with hyperglycemia: Secondary | ICD-10-CM | POA: Diagnosis not present

## 2023-07-18 ENCOUNTER — Other Ambulatory Visit: Payer: Self-pay | Admitting: Family Medicine

## 2023-07-18 LAB — HEPATIC FUNCTION PANEL
ALT: 20 IU/L (ref 0–44)
AST: 18 IU/L (ref 0–40)
Albumin: 4.7 g/dL (ref 3.9–4.9)
Alkaline Phosphatase: 95 IU/L (ref 44–121)
Bilirubin Total: 0.7 mg/dL (ref 0.0–1.2)
Bilirubin, Direct: 0.21 mg/dL (ref 0.00–0.40)
Total Protein: 7.7 g/dL (ref 6.0–8.5)

## 2023-07-18 LAB — BASIC METABOLIC PANEL
BUN/Creatinine Ratio: 13 (ref 10–24)
BUN: 14 mg/dL (ref 8–27)
CO2: 25 mmol/L (ref 20–29)
Calcium: 9.3 mg/dL (ref 8.6–10.2)
Chloride: 102 mmol/L (ref 96–106)
Creatinine, Ser: 1.12 mg/dL (ref 0.76–1.27)
Glucose: 94 mg/dL (ref 70–99)
Potassium: 5 mmol/L (ref 3.5–5.2)
Sodium: 142 mmol/L (ref 134–144)
eGFR: 75 mL/min/{1.73_m2} (ref >59)

## 2023-07-18 LAB — LIPID PANEL
Chol/HDL Ratio: 3.7 ratio (ref 0.0–5.0)
Cholesterol, Total: 176 mg/dL (ref 100–199)
HDL: 48 mg/dL (ref >39)
LDL Chol Calc (NIH): 102 mg/dL — ABNORMAL HIGH (ref 0–99)
Triglycerides: 145 mg/dL (ref 0–149)
VLDL Cholesterol Cal: 26 mg/dL (ref 5–40)

## 2023-07-18 LAB — HEMOGLOBIN A1C
Est. average glucose Bld gHb Est-mCnc: 160 mg/dL
Hgb A1c MFr Bld: 7.2 % — ABNORMAL HIGH (ref 4.8–5.6)

## 2023-07-23 ENCOUNTER — Ambulatory Visit: Payer: BC Managed Care – PPO | Admitting: Family Medicine

## 2023-07-23 VITALS — BP 132/70 | HR 87 | Temp 97.9°F | Ht 69.0 in | Wt 219.4 lb

## 2023-07-23 DIAGNOSIS — Z79899 Other long term (current) drug therapy: Secondary | ICD-10-CM

## 2023-07-23 DIAGNOSIS — E785 Hyperlipidemia, unspecified: Secondary | ICD-10-CM

## 2023-07-23 DIAGNOSIS — Z7984 Long term (current) use of oral hypoglycemic drugs: Secondary | ICD-10-CM

## 2023-07-23 DIAGNOSIS — E118 Type 2 diabetes mellitus with unspecified complications: Secondary | ICD-10-CM | POA: Diagnosis not present

## 2023-07-23 DIAGNOSIS — Z1211 Encounter for screening for malignant neoplasm of colon: Secondary | ICD-10-CM

## 2023-07-23 DIAGNOSIS — E1169 Type 2 diabetes mellitus with other specified complication: Secondary | ICD-10-CM

## 2023-07-23 MED ORDER — VALSARTAN-HYDROCHLOROTHIAZIDE 80-12.5 MG PO TABS: 1.0000 | ORAL_TABLET | Freq: Every day | ORAL | 1 refills | Status: DC

## 2023-07-23 MED ORDER — AMLODIPINE BESYLATE 10 MG PO TABS: 10.0000 mg | ORAL_TABLET | Freq: Every day | ORAL | 1 refills | Status: DC

## 2023-07-23 MED ORDER — EZETIMIBE 10 MG PO TABS: 10.0000 mg | ORAL_TABLET | Freq: Every day | ORAL | 2 refills | Status: DC

## 2023-07-23 MED ORDER — ROSUVASTATIN CALCIUM 40 MG PO TABS: 40.0000 mg | ORAL_TABLET | Freq: Every day | ORAL | 1 refills | Status: DC

## 2023-07-23 MED ORDER — TIRZEPATIDE 5 MG/0.5ML ~~LOC~~ SOAJ: 5.0000 mg | SUBCUTANEOUS | 4 refills | Status: DC

## 2023-07-23 MED ORDER — GLIPIZIDE ER 5 MG PO TB24: 5.0000 mg | ORAL_TABLET | Freq: Every day | ORAL | 1 refills | Status: DC

## 2023-07-23 MED ORDER — METFORMIN HCL ER 500 MG PO TB24: ORAL_TABLET | ORAL | 1 refills | Status: DC

## 2023-07-23 MED ORDER — PAROXETINE HCL 10 MG PO TABS: 10.0000 mg | ORAL_TABLET | Freq: Every day | ORAL | 11 refills | Status: DC

## 2023-07-23 NOTE — Progress Notes (Signed)
 Subjective:    Patient ID: Fernando Vance, male    DOB: [birth year], 63 y.o.   MRN: [id number]  Discussed the use of AI scribe software for clinical note transcription with the patient, who gave verbal consent to proceed.  History of Present Illness   Fernando Vance is a 63 year old male who presents for a routine follow-up visit.  He takes his medications regularly, including amlodipine for blood pressure, Jardiance and glipizide for diabetes, and rosuvastatin and ezetimibe for cholesterol. He also takes metformin, one in the morning and two in the evening, and paroxetine. He has been using Mounjaro, one shot a week, without any problems. He was previously on meloxicam for joint pain but has stopped it.    He is due for a colonoscopy and an eye exam due to his diabetes.     The patient was seen today as part of a comprehensive diabetic check up. Patient has diabetes Patient relates good compliance with taking the medication. We discussed their diet and exercise activities  We also discussed the importance of notifying us if any excessively high glucoses or low sugars.    Patient for blood pressure check up.  The patient does have hypertension.   Patient relates dietary measures try to minimize salt The importance of healthy diet and activity were discussed Patient relates compliance  Patient here for follow-up regarding cholesterol.    Patient relates taking medication on a regular basis Denies problems with medication Importance of dietary measures discussed Regular lab work regarding lipid and liver was checked and if needing additional labs was appropriately ordered      Review of Systems     Objective:    Physical Exam   VITALS: BP- 132/70     General-in no acute distress Eyes-no discharge Lungs-respiratory rate normal, CTA CV-no murmurs,RRR Extremities skin warm dry no edema Neuro grossly normal Behavior normal, alert       Assessment & Plan:   Assessment and Plan    Diabetes Mellitus A1c improved from 8.4 to 7.2. Mounjaro tolerated well. Plan to adjust medications based on efficacy and side effects. - Increase Mounjaro dose. - Discontinue Jardiance if Greggory Keen is effective. - Monitor for hypoglycemic episodes and reduce glipizide if necessary. - Continue metformin.  Hypertension Blood pressure well-controlled on amlodipine. - Continue amlodipine.  Hyperlipidemia LDL at 102, aiming for below 70. Continued medication necessary. - Continue ezetimibe and rosuvastatin.  General Health Maintenance Colonoscopy and eye exam due, important for diabetes management. - Schedule colonoscopy. - Schedule eye exam for diabetic retinopathy screening.  Follow-up Advised follow-up for diabetes management and health concerns. - Schedule follow-up appointment in the summer.      1. Hyperlipidemia associated with type 2 diabetes mellitus (HCC) (Primary) Goal is to keep LDL below 70 continue current medication healthy diet - Lipid Panel  2. Controlled diabetes mellitus type 2 with complications, unspecified whether long term insulin use (HCC) Keep A1c in the mid sixes bump up dose of Mounjaro Recently patient basically got released from his job he has insurance for 6 more months it may or may not cover this medication because of the cost If it does cover it I recommend tapering off of glipizide if it does not cover it we will maintain glipizide - Hemoglobin A1c - Basic Metabolic Panel  3. High risk medication use Labs pending before next visit - Hepatic Function Panel  4. Screening for colon cancer Referral for colonoscopy needs to get this done within  the next several months while he still has insurance - Ambulatory referral to Gastroenterology

## 2023-07-29 ENCOUNTER — Encounter: Payer: Self-pay | Admitting: *Deleted

## 2023-07-31 ENCOUNTER — Ambulatory Visit: Payer: BC Managed Care – PPO | Admitting: Urology

## 2023-08-20 ENCOUNTER — Telehealth: Payer: Self-pay | Admitting: *Deleted

## 2023-08-20 NOTE — Telephone Encounter (Signed)
 Copied from CRM [id number]. Topic: Clinical - Prescription Issue >> Aug 20, 2023 10:29 AM Patsy Lager T wrote: Reason for CRM: patients wife called stated a PA was needed for the dosage increase for tirzepatide [facility]) 5 MG/0.5ML Pen . Please send in the PA as insurance is requiring it

## 2023-08-20 NOTE — Telephone Encounter (Signed)
 PA submitted for the 2nd time by clinical pharmacist

## 2023-09-17 ENCOUNTER — Other Ambulatory Visit (HOSPITAL_COMMUNITY): Payer: Self-pay

## 2023-09-18 ENCOUNTER — Telehealth: Payer: Self-pay | Admitting: Pharmacy Technician

## 2023-09-18 ENCOUNTER — Other Ambulatory Visit (HOSPITAL_COMMUNITY): Payer: Self-pay

## 2023-09-18 NOTE — Telephone Encounter (Signed)
 Pharmacy Patient Advocate Encounter   Received notification from CoverMyMeds that prior authorization for Mounjaro 2.5MG /0.5ML auto-injectors is required/requested.   Insurance verification completed.   The patient is insured through  [employer]  .   Per test claim: PA required; PA submitted to above mentioned insurance via CoverMyMeds Key/confirmation #/EOC [id number] Status is pending

## 2023-09-21 ENCOUNTER — Other Ambulatory Visit (HOSPITAL_COMMUNITY): Payer: Self-pay

## 2023-09-22 ENCOUNTER — Other Ambulatory Visit (HOSPITAL_COMMUNITY): Payer: Self-pay

## 2023-09-22 NOTE — Telephone Encounter (Signed)
 Pharmacy Patient Advocate Encounter  Received notification from [employer] that Prior Authorization for Mounjaro 5MG /0.5ML auto-injectors has been APPROVED from 05/28/2023 to 05/27/2024. Unable to obtain price due to refill too soon rejection, last fill date 09/17/2023 next available fill date05/22/2025.

## 2023-09-30 ENCOUNTER — Other Ambulatory Visit (HOSPITAL_COMMUNITY): Payer: Self-pay

## 2023-09-30 ENCOUNTER — Telehealth: Payer: Self-pay | Admitting: Pharmacy Technician

## 2023-09-30 NOTE — Telephone Encounter (Signed)
 Pharmacy Patient Advocate Encounter   Received notification from Onbase that prior authorization for MOUNJARO 5MG /0.5ML AUTO-INJECTORS is required/requested.   Insurance verification completed.   The patient is insured through [employer] .   Per test claim: PA required; PA submitted to above mentioned insurance via Fax Key/confirmation #/EOC   Status is pending

## 2023-10-05 NOTE — Telephone Encounter (Signed)
 Pharmacy Patient Advocate Encounter  Received notification from [employer] that Prior Authorization for MOUNJARO 5MG /0.5ML has been APPROVED from 05/28/2023 to 05/27/2024.   PA #/Case ID/Reference #: [id number]

## 2023-10-14 ENCOUNTER — Other Ambulatory Visit (HOSPITAL_COMMUNITY): Payer: Self-pay

## 2023-11-23 DIAGNOSIS — E785 Hyperlipidemia, unspecified: Secondary | ICD-10-CM | POA: Diagnosis not present

## 2023-11-23 DIAGNOSIS — E1169 Type 2 diabetes mellitus with other specified complication: Secondary | ICD-10-CM | POA: Diagnosis not present

## 2023-11-23 DIAGNOSIS — Z79899 Other long term (current) drug therapy: Secondary | ICD-10-CM | POA: Diagnosis not present

## 2023-11-23 DIAGNOSIS — E118 Type 2 diabetes mellitus with unspecified complications: Secondary | ICD-10-CM | POA: Diagnosis not present

## 2023-11-24 ENCOUNTER — Ambulatory Visit: Payer: Self-pay | Admitting: Family Medicine

## 2023-11-24 LAB — BASIC METABOLIC PANEL WITH GFR
BUN/Creatinine Ratio: 17 (ref 10–24)
BUN: 19 mg/dL (ref 8–27)
CO2: 22 mmol/L (ref 20–29)
Calcium: 9.2 mg/dL (ref 8.6–10.2)
Chloride: 104 mmol/L (ref 96–106)
Creatinine, Ser: 1.11 mg/dL (ref 0.76–1.27)
Glucose: 110 mg/dL — ABNORMAL HIGH (ref 70–99)
Potassium: 4.6 mmol/L (ref 3.5–5.2)
Sodium: 142 mmol/L (ref 134–144)
eGFR: 76 mL/min/1.73 (ref >59)

## 2023-11-24 LAB — HEPATIC FUNCTION PANEL
ALT: 25 IU/L (ref 0–44)
AST: 20 IU/L (ref 0–40)
Albumin: 4.7 g/dL (ref 3.9–4.9)
Alkaline Phosphatase: 79 IU/L (ref 44–121)
Bilirubin Total: 0.7 mg/dL (ref 0.0–1.2)
Bilirubin, Direct: 0.2 mg/dL (ref 0.00–0.40)
Total Protein: 7.5 g/dL (ref 6.0–8.5)

## 2023-11-24 LAB — HEMOGLOBIN A1C
Est. average glucose Bld gHb Est-mCnc: 151 mg/dL
Hgb A1c MFr Bld: 6.9 % — ABNORMAL HIGH (ref 4.8–5.6)

## 2023-11-24 LAB — LIPID PANEL
Chol/HDL Ratio: 3.7 ratio (ref 0.0–5.0)
Cholesterol, Total: 169 mg/dL (ref 100–199)
HDL: 46 mg/dL (ref >39)
LDL Chol Calc (NIH): 96 mg/dL (ref 0–99)
Triglycerides: 157 mg/dL — ABNORMAL HIGH (ref 0–149)
VLDL Cholesterol Cal: 27 mg/dL (ref 5–40)

## 2023-11-25 ENCOUNTER — Ambulatory Visit: Admitting: Family Medicine

## 2023-11-25 VITALS — BP 138/78 | HR 97 | Temp 98.4°F | Ht 69.0 in | Wt 233.2 lb

## 2023-11-25 DIAGNOSIS — I700 Atherosclerosis of aorta: Secondary | ICD-10-CM | POA: Diagnosis not present

## 2023-11-25 DIAGNOSIS — N529 Male erectile dysfunction, unspecified: Secondary | ICD-10-CM

## 2023-11-25 DIAGNOSIS — I10 Essential (primary) hypertension: Secondary | ICD-10-CM

## 2023-11-25 DIAGNOSIS — E1169 Type 2 diabetes mellitus with other specified complication: Secondary | ICD-10-CM

## 2023-11-25 DIAGNOSIS — E118 Type 2 diabetes mellitus with unspecified complications: Secondary | ICD-10-CM

## 2023-11-25 DIAGNOSIS — E785 Hyperlipidemia, unspecified: Secondary | ICD-10-CM

## 2023-11-25 MED ORDER — AMLODIPINE BESYLATE 10 MG PO TABS: 10.0000 mg | ORAL_TABLET | Freq: Every day | ORAL | 1 refills | Status: DC

## 2023-11-25 MED ORDER — ROSUVASTATIN CALCIUM 40 MG PO TABS: 40.0000 mg | ORAL_TABLET | Freq: Every day | ORAL | 1 refills | Status: DC

## 2023-11-25 MED ORDER — METFORMIN HCL ER 500 MG PO TB24: ORAL_TABLET | ORAL | 1 refills | Status: DC

## 2023-11-25 MED ORDER — EZETIMIBE 10 MG PO TABS: 10.0000 mg | ORAL_TABLET | Freq: Every day | ORAL | 2 refills | Status: DC

## 2023-11-25 MED ORDER — TIRZEPATIDE 5 MG/0.5ML ~~LOC~~ SOAJ: 5.0000 mg | SUBCUTANEOUS | 4 refills | Status: DC

## 2023-11-25 MED ORDER — EMPAGLIFLOZIN 25 MG PO TABS: 25.0000 mg | ORAL_TABLET | Freq: Every day | ORAL | 1 refills | Status: DC

## 2023-11-25 MED ORDER — VALSARTAN-HYDROCHLOROTHIAZIDE 80-12.5 MG PO TABS: 1.0000 | ORAL_TABLET | Freq: Every day | ORAL | 1 refills | Status: DC

## 2023-11-25 NOTE — Progress Notes (Signed)
 Subjective:    Patient ID: Fernando Vance, male    DOB: 11/14/1960, 63 y.o.   MRN: [id number]  HPI PT comes in today for 4 month follow up on diabetes. Pt states he checks his glucose aprox every 3-4 days and everything has seemed normal with the last glucose reading of 137. Medications and allergies reviewed.  The patient was seen today as part of a comprehensive diabetic check up. Patient has diabetes Patient relates good compliance with taking the medication. We discussed their diet and exercise activities  We also discussed the importance of notifying us  if any excessively high glucoses or low sugars.   Patient here for follow-up regarding cholesterol.    Patient relates taking medication on a regular basis Denies problems with medication Importance of dietary measures discussed Regular lab work regarding lipid and liver was checked and if needing additional labs was appropriately ordered  Patient for blood pressure check up.  The patient does have hypertension.   Patient relates dietary measures try to minimize salt The importance of healthy diet and activity were discussed Patient relates compliance  Results for orders placed or performed in visit on 07/23/23  Hemoglobin A1c   Collection Time: 11/23/23  2:02 PM  Result Value Ref Range   Hgb A1c MFr Bld 6.9 (H) 4.8 - 5.6 %   Est. average glucose Bld gHb Est-mCnc 151 mg/dL  Basic Metabolic Panel   Collection Time: 11/23/23  2:02 PM  Result Value Ref Range   Glucose 110 (H) 70 - 99 mg/dL   BUN 19 8 - 27 mg/dL   Creatinine, Ser 8.88 0.76 - 1.27 mg/dL   eGFR 76 >40 fO/fpw/8.26   BUN/Creatinine Ratio 17 10 - 24   Sodium 142 134 - 144 mmol/L   Potassium 4.6 3.5 - 5.2 mmol/L   Chloride 104 96 - 106 mmol/L   CO2 22 20 - 29 mmol/L   Calcium  9.2 8.6 - 10.2 mg/dL  Lipid Panel   Collection Time: 11/23/23  2:02 PM  Result Value Ref Range   Cholesterol, Total 169 100 - 199 mg/dL   Triglycerides 842 (H) 0 - 149 mg/dL    HDL 46 >60 mg/dL   VLDL Cholesterol Cal 27 5 - 40 mg/dL   LDL Chol Calc (NIH) 96 0 - 99 mg/dL   Chol/HDL Ratio 3.7 0.0 - 5.0 ratio  Hepatic Function Panel   Collection Time: 11/23/23  2:02 PM  Result Value Ref Range   Total Protein 7.5 6.0 - 8.5 g/dL   Albumin 4.7 3.9 - 4.9 g/dL   Bilirubin Total 0.7 0.0 - 1.2 mg/dL   Bilirubin, Direct 9.79 0.00 - 0.40 mg/dL   Alkaline Phosphatase 79 44 - 121 IU/L   AST 20 0 - 40 IU/L   ALT 25 0 - 44 IU/L    Review of Systems     Objective:   Physical Exam General-in no acute distress Eyes-no discharge Lungs-respiratory rate normal, CTA CV-no murmurs,RRR Extremities skin warm dry no edema Neuro grossly normal Behavior normal, alert        Assessment & Plan:  1. Hyperlipidemia associated with type 2 diabetes mellitus (HCC) (Primary) Hyperlipidemia-importance of diet, weight control, activity, compliance with medications discussed.   Recent labs reviewed.   Any additional labs or refills ordered.   Importance of keeping under good control discussed. Regular follow-up visits discussed The goal is to get LDL below 70 if possible I encouraged patient to be steady with taking his medications  and healthy eating and sitting and walking and trying to lose weight we will relook at the lab work again within 6 months  2. Controlled diabetes mellitus type 2 with complications, unspecified whether long term insulin use (HCC) A1c under good control The patient was seen today as part of a comprehensive visit for diabetes. The importance of keeping her A1c at or below 7 range was discussed.  Discussed diet, activity, and medication compliance Emphasized healthy eating primarily with vegetables fruits and if utilizing meats lean meats such as chicken or fish grilled baked broiled Avoid sugary drinks Minimize and avoid processed foods Fit in regular physical activity preferably 25 to 30 minutes 4 times per week Standard follow-up visit recommended.   Patient aware lack of control and follow-up increases risk of diabetic complications. Regular follow-up visits Yearly ophthalmology Yearly foot exam   3. HTN (hypertension), benign HTN- patient seen for follow-up regarding HTN.   Diet, medication compliance, appropriate labs and refills were completed.   Importance of keeping blood pressure under good control to lessen the risk of complications discussed Regular follow-up visits discussed   4. Erectile dysfunction, unspecified erectile dysfunction type Takes his medications Paxil  on a regular basis  5. Aortic calcification (HCC) Healthy diet regular activity try to lose weight also try to get LDL below 70

## 2023-12-02 ENCOUNTER — Other Ambulatory Visit: Payer: Self-pay

## 2023-12-02 DIAGNOSIS — E1169 Type 2 diabetes mellitus with other specified complication: Secondary | ICD-10-CM

## 2023-12-02 DIAGNOSIS — Z79899 Other long term (current) drug therapy: Secondary | ICD-10-CM

## 2023-12-02 DIAGNOSIS — E118 Type 2 diabetes mellitus with unspecified complications: Secondary | ICD-10-CM

## 2023-12-02 DIAGNOSIS — I10 Essential (primary) hypertension: Secondary | ICD-10-CM

## 2023-12-02 DIAGNOSIS — Z125 Encounter for screening for malignant neoplasm of prostate: Secondary | ICD-10-CM

## 2024-01-21 ENCOUNTER — Telehealth: Payer: Self-pay | Admitting: *Deleted

## 2024-01-21 ENCOUNTER — Other Ambulatory Visit: Payer: Self-pay | Admitting: Family Medicine

## 2024-01-21 MED ORDER — TIRZEPATIDE 5 MG/0.5ML ~~LOC~~ SOAJ: 5.0000 mg | SUBCUTANEOUS | 4 refills | Status: DC

## 2024-01-21 NOTE — Telephone Encounter (Signed)
 Patient wife aware

## 2024-01-21 NOTE — Telephone Encounter (Signed)
 Mounjaro  5 mg and Jardiance  25 mg  He has appt in Jan Do labs before visit  Let us  know if any issues

## 2024-01-21 NOTE — Telephone Encounter (Signed)
 Copied from CRM #[id number]. Topic: Clinical - Medication Question >> Jan 20, 2024  2:50 PM Fonda T wrote: Reason for CRM: Received call from patient spouse, Bridgette inquiring what dose he should be on for medication, Tirzepatide  (MOUNJARO ).  Per caller, states pharmacy indicates they have 2 different prescriptions and different doses for patient, and needs clarity on which patient is to be taking.  Can be reached at [phone]  [id number] [place], [place] - [address] [facility] [address] [place] [address] Phone: [phone] Fax: [phone]

## 2024-01-29 ENCOUNTER — Encounter (INDEPENDENT_AMBULATORY_CARE_PROVIDER_SITE_OTHER): Payer: Self-pay | Admitting: *Deleted

## 2024-02-19 ENCOUNTER — Other Ambulatory Visit (HOSPITAL_COMMUNITY): Payer: Self-pay

## 2024-02-19 ENCOUNTER — Telehealth: Payer: Self-pay | Admitting: Pharmacy Technician

## 2024-02-19 NOTE — Telephone Encounter (Signed)
 Pharmacy Patient Advocate Encounter  Received notification from [facility] that Prior Authorization for Mounjaro  5MG /0.5ML auto-injectors has been APPROVED from 02/19/2024 to 02/18/2027. Ran test claim, Copay is $30.00. This test claim was processed through [facility]- copay amounts may vary at other pharmacies due to pharmacy/plan contracts, or as the patient moves through the different stages of their insurance plan.   PA #/Case ID/Reference #: [id number]

## 2024-02-19 NOTE — Telephone Encounter (Signed)
 Pharmacy Patient Advocate Encounter   Received notification from CoverMyMeds that prior authorization for Mounjaro  5MG /0.5ML auto-injectors is required/requested.   Insurance verification completed.   The patient is insured through [facility].   Per test claim: PA required; PA submitted to above mentioned insurance via Latent Key/confirmation #/EOC [id number] Status is pending

## 2024-05-25 ENCOUNTER — Telehealth: Payer: Self-pay

## 2024-05-25 NOTE — Telephone Encounter (Signed)
 Copied from CRM [id number]. Topic: Clinical - Medication Question >> May 20, 2024  4:16 PM Antwanette L wrote: Reason for CRM: Bridgette, the patient wife called b/c the [facility] indicating that they are now requiring a 90- day supply for all of his medications.

## 2024-05-26 ENCOUNTER — Other Ambulatory Visit: Payer: Self-pay | Admitting: Family Medicine

## 2024-05-26 DIAGNOSIS — E1165 Type 2 diabetes mellitus with hyperglycemia: Secondary | ICD-10-CM

## 2024-05-26 DIAGNOSIS — Z125 Encounter for screening for malignant neoplasm of prostate: Secondary | ICD-10-CM

## 2024-05-26 DIAGNOSIS — I10 Essential (primary) hypertension: Secondary | ICD-10-CM

## 2024-05-26 DIAGNOSIS — Z79899 Other long term (current) drug therapy: Secondary | ICD-10-CM

## 2024-05-26 DIAGNOSIS — E1169 Type 2 diabetes mellitus with other specified complication: Secondary | ICD-10-CM

## 2024-05-26 MED ORDER — AMLODIPINE BESYLATE 10 MG PO TABS: 10.0000 mg | ORAL_TABLET | Freq: Every day | ORAL | 1 refills | Status: AC

## 2024-05-26 MED ORDER — ROSUVASTATIN CALCIUM 40 MG PO TABS: 40.0000 mg | ORAL_TABLET | Freq: Every day | ORAL | 1 refills | Status: AC

## 2024-05-26 MED ORDER — VALSARTAN-HYDROCHLOROTHIAZIDE 80-12.5 MG PO TABS: 1.0000 | ORAL_TABLET | Freq: Every day | ORAL | 1 refills | Status: AC

## 2024-05-26 MED ORDER — PAROXETINE HCL 10 MG PO TABS: 10.0000 mg | ORAL_TABLET | Freq: Every day | ORAL | 2 refills | Status: AC

## 2024-05-26 MED ORDER — EZETIMIBE 10 MG PO TABS: 10.0000 mg | ORAL_TABLET | Freq: Every day | ORAL | 2 refills | Status: AC

## 2024-05-26 MED ORDER — METFORMIN HCL ER 500 MG PO TB24: ORAL_TABLET | ORAL | 1 refills | Status: AC

## 2024-05-26 MED ORDER — EMPAGLIFLOZIN 25 MG PO TABS: 25.0000 mg | ORAL_TABLET | Freq: Every day | ORAL | 1 refills | Status: AC

## 2024-05-26 MED ORDER — TIRZEPATIDE 5 MG/0.5ML ~~LOC~~ SOAJ: 5.0000 mg | SUBCUTANEOUS | 3 refills | Status: AC

## 2024-05-26 NOTE — Telephone Encounter (Signed)
 Please call family [facility]ify is that [facility]? Or [facility]? Also that is for all of his chronic medications that we prescribed I assume? Please let me know thank you

## 2024-05-26 NOTE — Telephone Encounter (Signed)
 Called patient and wife verbalized that he needs refills on all medications prescribed with a 90 day supply and to the local [facility] on file. Not the mail in. Thanks

## 2024-05-26 NOTE — Telephone Encounter (Signed)
 Refill sent in as requested.

## 2024-05-27 ENCOUNTER — Ambulatory Visit: Admitting: Family Medicine

## 2024-08-15 ENCOUNTER — Ambulatory Visit: Admitting: Family Medicine
